# Patient Record
Sex: Female | Born: 1935 | Race: Black or African American | Hispanic: No | Marital: Single | Smoking: Never smoker
Health system: Southern US, Community
[De-identification: ages and names within clinical notes are randomized; demographics above are authoritative.]

## PROBLEM LIST (undated history)

## (undated) DIAGNOSIS — C801 Malignant (primary) neoplasm, unspecified: Secondary | ICD-10-CM

## (undated) DIAGNOSIS — I10 Essential (primary) hypertension: Secondary | ICD-10-CM

---

## 2019-04-18 ENCOUNTER — Encounter (HOSPITAL_COMMUNITY): Payer: Self-pay

## 2019-04-18 ENCOUNTER — Ambulatory Visit (HOSPITAL_COMMUNITY)
Admission: EM | Admit: 2019-04-18 | Discharge: 2019-04-18 | Disposition: A | Discharge status: Home / Self Care | Payer: Medicare Other | Attending: Urgent Care | Admitting: Urgent Care

## 2019-04-18 ENCOUNTER — Other Ambulatory Visit: Payer: Self-pay

## 2019-04-18 DIAGNOSIS — I2510 Atherosclerotic heart disease of native coronary artery without angina pectoris: Secondary | ICD-10-CM | POA: Insufficient documentation

## 2019-04-18 DIAGNOSIS — I2583 Coronary atherosclerosis due to lipid rich plaque: Secondary | ICD-10-CM | POA: Diagnosis present

## 2019-04-18 DIAGNOSIS — C8510 Unspecified B-cell lymphoma, unspecified site: Secondary | ICD-10-CM

## 2019-04-18 DIAGNOSIS — I10 Essential (primary) hypertension: Secondary | ICD-10-CM | POA: Insufficient documentation

## 2019-04-18 DIAGNOSIS — R42 Dizziness and giddiness: Secondary | ICD-10-CM

## 2019-04-18 DIAGNOSIS — R350 Frequency of micturition: Secondary | ICD-10-CM | POA: Diagnosis present

## 2019-04-18 DIAGNOSIS — N6310 Unspecified lump in the right breast, unspecified quadrant: Secondary | ICD-10-CM | POA: Diagnosis present

## 2019-04-18 HISTORY — DX: Essential (primary) hypertension: I10

## 2019-04-18 LAB — POCT URINALYSIS DIP (DEVICE)
Bilirubin Urine: NEGATIVE
Glucose, UA: NEGATIVE mg/dL
Ketones, ur: NEGATIVE mg/dL
Leukocytes,Ua: NEGATIVE
Nitrite: NEGATIVE
Protein, ur: NEGATIVE mg/dL
Specific Gravity, Urine: 1.025 (ref 1.005–1.030)
Urobilinogen, UA: 0.2 mg/dL (ref 0.0–1.0)
pH: 7 (ref 5.0–8.0)

## 2019-04-18 MED ORDER — MECLIZINE HCL 12.5 MG PO TABS: 12.5000 mg | ORAL_TABLET | Freq: Two times a day (BID) | ORAL | 0 refills | Status: DC | PRN

## 2019-04-18 NOTE — ED Provider Notes (Signed)
MRN: [id number] DOB: Dec 13, 1933  Subjective:   Jasmine Lin is a 85 y.o. female presenting for 1 day history of dizziness lasting several seconds but less than a minute when she gets up from a laying or seated position.  She has also been urinating more frequently.  Patient has extensive past medical history as documented below.  She is currently taking one blood pressure medication, lisinopril.  She has consistent follow-up with her oncologist.   No current facility-administered medications for this encounter.   Current Outpatient Medications:  .  aspirin 81 MG chewable tablet, Chew by mouth., Disp: , Rfl:  .  isosorbide mononitrate (IMDUR) 30 MG 24 hr tablet, TAKE 1 TABLE BY MOUTH DAILY, Disp: , Rfl:  .  lisinopril (ZESTRIL) 20 MG tablet, 20 mg daily., Disp: , Rfl:  .  allopurinol (ZYLOPRIM) 100 MG tablet, Take by mouth., Disp: , Rfl:     No Known Allergies   Past Medical History:  Diagnosis Date  . Arthritis  . B12 deficiency  H/O  . CAD (coronary artery disease)  2004 DES to diagonal and ostial RCA Followed by Dr. Darien Ramus with [facility].  . Cardiomyopathy (CMS-HCC) 2013  EF 60-65%  . Eye problems  . Gout  . Hypertension  . Insomnia  . Lymphoma (CMS-HCC)  . Memory changes  . Migraines  . Osteopenia  . Vitamin D deficiency     Past Surgical History:  Procedure Laterality Date  . CARDIAC CATHETERIZATION 2006 and 2010  stent patent and EF normal-stent placement x 2  . CATARACT EXTRACTION Right 07/26/2017  . CATARACT EXTRACTION Left 01/2017  . PR INSERT TUNNELED CV CATH WITH PORT N/A 08/19/2018  Procedure: INSERTION OF CENTRAL VENOUS ACCESS DEVICE; Surgeon: Livia Snellen, MD; Location: [facility]; Service: General Surgery  . PR XCAPSL CTRC RMVL INSJ IO LENS PROSTH W/O ECP Right 07/26/2017  Procedure: CATARACT RIGHT EYE; Surgeon: Elon Spanner, MD; Location: [place]; Service: Ophthalmology  . PUNCH BIOPSY Right 03/04/2017  Right  nipple, punch biopsy of skin  . TOTAL KNEE ARTHROPLASTY Left 05/2013    Family History  Problem Relation Age of Onset  . Cancer Father  . Hypertension Father  . Heart disease Brother  . Depression Neg Hx  . Mental illness Neg Hx    Immunization History  Administered Date(s) Administered  . Influenza Vaccine Quad (IIV4 PF) 44mo+ injectable 07/04/2013, 08/01/2018  . PNEUMOCOCCAL POLYSACCHARIDE 23 07/04/2013  . Pneumococcal Conjugate 13-Valent 09/25/2014    Denies drug use, smoking cigarettes, alcohol use.   ROS Denies confusion, headache, ear pain, throat pain, chest pain, shortness of breath, difficulty breathing, nausea, vomiting, belly pain, hematuria, calf pain, calf swelling.  Objective:   Vitals: BP (!) 187/79 (BP Location: Right Arm)   Pulse 78   Temp 98.2 F (36.8 C) (Oral)   Resp 16   Wt 135 lb (61.2 kg)   SpO2 97%   Recheck BP was 173/80 by PA-Jael Waldorf for left arm, seated position.   Physical Exam Constitutional:      General: She is not in acute distress.    Appearance: Normal appearance. She is well-developed. She is not ill-appearing, toxic-appearing or diaphoretic.  HENT:     Head: Normocephalic and atraumatic.     Right Ear: Tympanic membrane and external ear normal. There is no impacted cerumen.     Left Ear: Tympanic membrane and external ear normal. There is no impacted cerumen.     Nose: Nose normal.  Mouth/Throat:     Mouth: Mucous membranes are moist.  Eyes:     Extraocular Movements: Extraocular movements intact.     Pupils: Pupils are equal, round, and reactive to light.  Cardiovascular:     Rate and Rhythm: Normal rate and regular rhythm.     Pulses: Normal pulses.     Heart sounds: Normal heart sounds. No murmur. No friction rub. No gallop.   Pulmonary:     Effort: Pulmonary effort is normal. No respiratory distress.     Breath sounds: Normal breath sounds. No stridor. No wheezing, rhonchi or rales.  Skin:    General: Skin is warm and  dry.     Findings: No rash.  Neurological:     Mental Status: She is alert and oriented to person, place, and time.     Cranial Nerves: No cranial nerve deficit.     Coordination: Coordination abnormal (waddling).     Gait: Gait abnormal (mild wide gait).     Deep Tendon Reflexes: Reflexes normal.     Comments: Negative pronator drift and Romberg.  Psychiatric:        Mood and Affect: Mood normal.        Behavior: Behavior normal.        Thought Content: Thought content normal.    Results for orders placed or performed during the hospital encounter of 04/18/19 (from the past 24 hour(s))  POCT urinalysis dip (device)     Status: Abnormal   Collection Time: 04/18/19  3:22 PM  Result Value Ref Range   Glucose, UA NEGATIVE NEGATIVE mg/dL   Bilirubin Urine NEGATIVE NEGATIVE   Ketones, ur NEGATIVE NEGATIVE mg/dL   Specific Gravity, Urine 1.025 1.005 - 1.030   Hgb urine dipstick TRACE (A) NEGATIVE   pH 7.0 5.0 - 8.0   Protein, ur NEGATIVE NEGATIVE mg/dL   Urobilinogen, UA 0.2 0.0 - 1.0 mg/dL   Nitrite NEGATIVE NEGATIVE   Leukocytes,Ua NEGATIVE NEGATIVE    Assessment and Plan :   1. Dizziness   2. Essential hypertension   3. Elevated blood pressure reading with diagnosis of hypertension   4. B-cell lymphoma, unspecified B-cell lymphoma type, unspecified body region ([place])   5. Breast mass, right   6. Coronary artery disease due to lipid rich plaque     Patient is nontoxic-appearing, has reassuring physical exam findings.  Blood pressure is elevated but I am not inclined to adjust her blood pressure medications that she has very consistent follow-up with her PCP and oncologist.  Counseled patient on differential that includes need for ER evaluation including ACS, stroke, aneurysm, thrombotic event, discussed signs and symptoms for this and we were all agreeable to hold off on pursuing ER evaluation to rule these differential diagnoses out.  Patient will have very close follow-up with  her PCP.  In the meantime will use meclizine to address her dizziness, maintain hydration.  Urine cultures pending. Counseled patient on potential for adverse effects with medications prescribed/recommended today, ER and return-to-clinic precautions discussed, patient verbalized understanding.    Jaynee Eagles, PA-C 04/18/19 1553

## 2019-04-18 NOTE — ED Triage Notes (Signed)
Pt states she's dizzy when she gets up and started to move around. Pt states this started yesterday.

## 2019-04-19 LAB — URINE CULTURE: Culture: NO GROWTH

## 2019-08-01 ENCOUNTER — Encounter (HOSPITAL_COMMUNITY): Payer: Self-pay

## 2019-08-01 ENCOUNTER — Other Ambulatory Visit: Payer: Self-pay

## 2019-08-01 ENCOUNTER — Emergency Department (HOSPITAL_COMMUNITY)
Admission: EM | Admit: 2019-08-01 | Discharge: 2019-08-01 | Disposition: A | Discharge status: Home / Self Care | Payer: Medicare Other | Attending: Emergency Medicine | Admitting: Emergency Medicine

## 2019-08-01 ENCOUNTER — Emergency Department (HOSPITAL_COMMUNITY): Payer: Medicare Other

## 2019-08-01 DIAGNOSIS — Z79899 Other long term (current) drug therapy: Secondary | ICD-10-CM | POA: Insufficient documentation

## 2019-08-01 DIAGNOSIS — I10 Essential (primary) hypertension: Secondary | ICD-10-CM | POA: Insufficient documentation

## 2019-08-01 LAB — CBC WITH DIFFERENTIAL/PLATELET
Abs Immature Granulocytes: 0.01 10*3/uL (ref 0.00–0.07)
Basophils Absolute: 0 10*3/uL (ref 0.0–0.1)
Basophils Relative: 1 %
Eosinophils Absolute: 0.2 10*3/uL (ref 0.0–0.5)
Eosinophils Relative: 4 %
HCT: 38.2 % (ref 36.0–46.0)
Hemoglobin: 12 g/dL (ref 12.0–15.0)
Immature Granulocytes: 0 %
Lymphocytes Relative: 47 %
Lymphs Abs: 2 10*3/uL (ref 0.7–4.0)
MCH: 31.3 pg (ref 26.0–34.0)
MCHC: 31.4 g/dL (ref 30.0–36.0)
MCV: 99.7 fL (ref 80.0–100.0)
Monocytes Absolute: 0.4 10*3/uL (ref 0.1–1.0)
Monocytes Relative: 9 %
Neutro Abs: 1.7 10*3/uL (ref 1.7–7.7)
Neutrophils Relative %: 39 %
Platelets: 200 10*3/uL (ref 150–400)
RBC: 3.83 MIL/uL — ABNORMAL LOW (ref 3.87–5.11)
RDW: 13.2 % (ref 11.5–15.5)
WBC: 4.3 10*3/uL (ref 4.0–10.5)
nRBC: 0 % (ref 0.0–0.2)

## 2019-08-01 LAB — BASIC METABOLIC PANEL
Anion gap: 11 (ref 5–15)
BUN: 11 mg/dL (ref 8–23)
CO2: 27 mmol/L (ref 22–32)
Calcium: 9.2 mg/dL (ref 8.9–10.3)
Chloride: 104 mmol/L (ref 98–111)
Creatinine, Ser: 0.88 mg/dL (ref 0.44–1.00)
GFR calc Af Amer: >60 mL/min (ref >60)
GFR calc non Af Amer: >60 mL/min (ref >60)
Glucose, Bld: 101 mg/dL — ABNORMAL HIGH (ref 70–99)
Potassium: 3.8 mmol/L (ref 3.5–5.1)
Sodium: 142 mmol/L (ref 135–145)

## 2019-08-01 MED ORDER — ISOSORBIDE MONONITRATE ER 30 MG PO TB24
60.0000 mg | ORAL_TABLET | Freq: Every day | ORAL | Status: DC
  Administered 2019-08-01: 60 mg via ORAL
  Filled 2019-08-01 (×2): qty 2

## 2019-08-01 MED ORDER — ISOSORBIDE DINITRATE ER 40 MG PO TBCR: 40.0000 mg | EXTENDED_RELEASE_TABLET | Freq: Two times a day (BID) | ORAL | Status: DC

## 2019-08-01 MED ORDER — ISOSORBIDE MONONITRATE ER 60 MG PO TB24: 60.0000 mg | ORAL_TABLET | Freq: Every day | ORAL | 0 refills | Status: DC

## 2019-08-01 MED ORDER — LISINOPRIL 10 MG PO TABS: 10.0000 mg | ORAL_TABLET | Freq: Once | ORAL | Status: DC

## 2019-08-01 MED ORDER — LISINOPRIL 20 MG PO TABS
20.0000 mg | ORAL_TABLET | Freq: Once | ORAL | Status: AC
  Administered 2019-08-01: 20 mg via ORAL
  Filled 2019-08-01: qty 1

## 2019-08-01 NOTE — ED Notes (Signed)
RN spoke with patients son Kyung Rudd and updated on patients status. Will be picking patient up in 10-15 mins.

## 2019-08-01 NOTE — ED Provider Notes (Signed)
Emergency Department Provider Note   I have reviewed the triage vital signs and the nursing notes.   HISTORY  Chief Complaint Hypertension   HPI Jasmine Lin is a 85 y.o. female with history of hypertension who presents the emergency department today with hypertension.  Patient states that she was taken off all her medications but in the notes it appears that she still on 2 of them.  She is on lisinopril and isosorbide.  Patient denies any headache, blurry vision, neck pain, chest pain, back pain, shortness of breath, decreased urination, lower extremity swelling, abdominal swelling, paresthesias or other symptoms.   No other associated or modifying symptoms.    Past Medical History:  Diagnosis Date  . Hypertension     There are no problems to display for this patient.   History reviewed. No pertinent surgical history.  Current Outpatient Rx  . Order #: [id number] Class: Historical Med  . Order #: [id number] Class: Historical Med  . Order #: [id number] Class: Normal  . Order #: [id number] Class: Historical Med  . Order #: [id number] Class: Normal    Allergies Patient has no known allergies.  History reviewed. No pertinent family history.  Social History Social History   Tobacco Use  . Smoking status: Never Smoker  . Smokeless tobacco: Never Used  Substance Use Topics  . Alcohol use: Never  . Drug use: Never    Review of Systems  All other systems negative except as documented in the HPI. All pertinent positives and negatives as reviewed in the HPI. ____________________________________________   PHYSICAL EXAM:  VITAL SIGNS: ED Triage Vitals  Enc Vitals Group     BP 08/01/19 0208 (!) 199/71     Pulse Rate 08/01/19 0208 63     Resp 08/01/19 0208 18     Temp 08/01/19 0208 98.4 F (36.9 C)     Temp Source 08/01/19 0208 Oral     SpO2 08/01/19 0157 98 %     Weight 08/01/19 0200 134 lb 7.7 oz (61 kg)     Height 08/01/19 0200 5\' 3"  (1.6 m)     Constitutional: Alert and oriented. Well appearing and in no acute distress. Eyes: Conjunctivae are normal. PERRL. EOMI. Head: Atraumatic. Nose: No congestion/rhinnorhea. Mouth/Throat: Mucous membranes are moist.  Oropharynx non-erythematous. Neck: No stridor.  No meningeal signs.   Cardiovascular: Normal rate, regular rhythm. Good peripheral circulation. Grossly normal heart sounds.   Respiratory: Normal respiratory effort.  No retractions. Lungs CTAB. Gastrointestinal: Soft and nontender. No distention.  Musculoskeletal: No lower extremity tenderness nor edema. No gross deformities of extremities. Neurologic:  No altered mental status, able to give full seemingly accurate history.  Face is symmetric, EOM's intact, pupils equal and reactive, vision intact, tongue and uvula midline without deviation. Upper and Lower extremity motor 5/5, intact pain perception in distal extremities, 2+ reflexes in biceps, patella and achilles tendons. Able to perform finger to nose normal with both hands.  Skin:  Skin is warm, dry and intact. No rash noted.  ____________________________________________   LABS (all labs ordered are listed, but only abnormal results are displayed)  Labs Reviewed  CBC WITH DIFFERENTIAL/PLATELET - Abnormal; Notable for the following components:      Result Value   RBC 3.83 (*)    All other components within normal limits  BASIC METABOLIC PANEL - Abnormal; Notable for the following components:   Glucose, Bld 101 (*)    All other components within normal limits   ____________________________________________  EKG   EKG Interpretation  Date/Time:  Tuesday August 01 2019 05:52:57 EST Ventricular Rate:  59 PR Interval:  222 QRS Duration: 88 QT Interval:  440 QTC Calculation: 435 R Axis:   -23 Text Interpretation: Sinus bradycardia with 1st degree A-V block Minimal voltage criteria for LVH, may be normal variant ( Cornell product ) Borderline ECG No old tracing  to compare Confirmed by Merrily Pew [phone]) on 08/01/2019 6:44:15 AM       ____________________________________________  RADIOLOGY  DG Chest 2 View  Result Date: 08/01/2019 CLINICAL DATA:  Hypertensive EXAM: CHEST - 2 VIEW COMPARISON:  None. FINDINGS: Normal heart size. Coronary atherosclerotic calcification. Negative mediastinal contours. Large lung volumes without diaphragm flattening. There is no edema, consolidation, effusion, or pneumothorax. IMPRESSION: No active cardiopulmonary disease. Electronically Signed   By: Monte Fantasia M.D.   On: 08/01/2019 05:29    ____________________________________________   PROCEDURES  Procedure(s) performed:   Procedures   ____________________________________________   INITIAL IMPRESSION / ASSESSMENT AND PLAN / ED COURSE  Significant HTN at home. Not quite as bad here. Will check basic labs, but likely discharge on PCP follow up.  BP elevated by asymptomatic. Plan to increase home meds with pcp follow up.   Pertinent labs & imaging results that were available during my care of the patient were reviewed by me and considered in my medical decision making (see chart for details).  A medical screening exam was performed and I feel the patient has had an appropriate workup for their chief complaint at this time and likelihood of emergent condition existing is low. They have been counseled on decision, discharge, follow up and which symptoms necessitate immediate return to the emergency department. They or their family verbally stated understanding and agreement with plan and discharged in stable condition.   ____________________________________________  FINAL CLINICAL IMPRESSION(S) / ED DIAGNOSES  Final diagnoses:  Hypertension, unspecified type    MEDICATIONS GIVEN DURING THIS VISIT:  Medications  isosorbide mononitrate (IMDUR) 24 hr tablet 60 mg (60 mg Oral Given 08/01/19 0651)  lisinopril (ZESTRIL) tablet 20 mg (20 mg Oral  Given 08/01/19 0651)     NEW OUTPATIENT MEDICATIONS STARTED DURING THIS VISIT:  New Prescriptions   ISOSORBIDE MONONITRATE (IMDUR) 60 MG 24 HR TABLET    Take 1 tablet (60 mg total) by mouth daily.    Note:  This note was prepared with assistance of Dragon voice recognition software. Occasional wrong-word or sound-a-like substitutions may have occurred due to the inherent limitations of voice recognition software.   Sharise Lippy, Corene Cornea, MD 08/01/19 [phone]

## 2019-08-01 NOTE — ED Triage Notes (Signed)
Pt BIB [facility] for eval of hypertension. Pt reports her PCP pulled her off of all 3 blood pressure medications 2 mos ago, pt reports that she has been checking pressures at home and she noted tonight that her systolic was [id number] systolic and called EMS. Pt has no complaints, no neuro deficits, GCS 15, A&Ox4.

## 2019-08-07 ENCOUNTER — Emergency Department (HOSPITAL_COMMUNITY): Payer: Medicare Other

## 2019-08-07 ENCOUNTER — Encounter (HOSPITAL_COMMUNITY): Payer: Self-pay | Admitting: Emergency Medicine

## 2019-08-07 ENCOUNTER — Emergency Department (HOSPITAL_COMMUNITY)
Admission: EM | Admit: 2019-08-07 | Discharge: 2019-08-07 | Disposition: A | Discharge status: Home / Self Care | Payer: Medicare Other | Attending: Emergency Medicine | Admitting: Emergency Medicine

## 2019-08-07 ENCOUNTER — Other Ambulatory Visit: Payer: Self-pay

## 2019-08-07 DIAGNOSIS — I10 Essential (primary) hypertension: Secondary | ICD-10-CM | POA: Insufficient documentation

## 2019-08-07 DIAGNOSIS — Z79899 Other long term (current) drug therapy: Secondary | ICD-10-CM | POA: Insufficient documentation

## 2019-08-07 DIAGNOSIS — R42 Dizziness and giddiness: Secondary | ICD-10-CM | POA: Diagnosis not present

## 2019-08-07 DIAGNOSIS — Z7982 Long term (current) use of aspirin: Secondary | ICD-10-CM | POA: Diagnosis not present

## 2019-08-07 DIAGNOSIS — R791 Abnormal coagulation profile: Secondary | ICD-10-CM | POA: Insufficient documentation

## 2019-08-07 LAB — CBC
HCT: 38.8 % (ref 36.0–46.0)
Hemoglobin: 12.5 g/dL (ref 12.0–15.0)
MCH: 32.1 pg (ref 26.0–34.0)
MCHC: 32.2 g/dL (ref 30.0–36.0)
MCV: 99.5 fL (ref 80.0–100.0)
Platelets: 206 10*3/uL (ref 150–400)
RBC: 3.9 MIL/uL (ref 3.87–5.11)
RDW: 13.2 % (ref 11.5–15.5)
WBC: 4.6 10*3/uL (ref 4.0–10.5)
nRBC: 0 % (ref 0.0–0.2)

## 2019-08-07 LAB — I-STAT CHEM 8, ED
BUN: 16 mg/dL (ref 8–23)
Calcium, Ion: 1.05 mmol/L — ABNORMAL LOW (ref 1.15–1.40)
Chloride: 107 mmol/L (ref 98–111)
Creatinine, Ser: 0.9 mg/dL (ref 0.44–1.00)
Glucose, Bld: 102 mg/dL — ABNORMAL HIGH (ref 70–99)
HCT: 38 % (ref 36.0–46.0)
Hemoglobin: 12.9 g/dL (ref 12.0–15.0)
Potassium: 3.7 mmol/L (ref 3.5–5.1)
Sodium: 141 mmol/L (ref 135–145)
TCO2: 26 mmol/L (ref 22–32)

## 2019-08-07 LAB — DIFFERENTIAL
Abs Immature Granulocytes: 0 10*3/uL (ref 0.00–0.07)
Basophils Absolute: 0 10*3/uL (ref 0.0–0.1)
Basophils Relative: 0 %
Eosinophils Absolute: 0.2 10*3/uL (ref 0.0–0.5)
Eosinophils Relative: 4 %
Immature Granulocytes: 0 %
Lymphocytes Relative: 45 %
Lymphs Abs: 2.1 10*3/uL (ref 0.7–4.0)
Monocytes Absolute: 0.4 10*3/uL (ref 0.1–1.0)
Monocytes Relative: 9 %
Neutro Abs: 1.9 10*3/uL (ref 1.7–7.7)
Neutrophils Relative %: 42 %

## 2019-08-07 LAB — COMPREHENSIVE METABOLIC PANEL
ALT: 16 U/L (ref 0–44)
AST: 21 U/L (ref 15–41)
Albumin: 3.5 g/dL (ref 3.5–5.0)
Alkaline Phosphatase: 84 U/L (ref 38–126)
Anion gap: 11 (ref 5–15)
BUN: 15 mg/dL (ref 8–23)
CO2: 22 mmol/L (ref 22–32)
Calcium: 9 mg/dL (ref 8.9–10.3)
Chloride: 107 mmol/L (ref 98–111)
Creatinine, Ser: 0.91 mg/dL (ref 0.44–1.00)
GFR calc Af Amer: >60 mL/min (ref >60)
GFR calc non Af Amer: 58 mL/min — ABNORMAL LOW (ref >60)
Glucose, Bld: 106 mg/dL — ABNORMAL HIGH (ref 70–99)
Potassium: 3.8 mmol/L (ref 3.5–5.1)
Sodium: 140 mmol/L (ref 135–145)
Total Bilirubin: 0.5 mg/dL (ref 0.3–1.2)
Total Protein: 6.8 g/dL (ref 6.5–8.1)

## 2019-08-07 LAB — CBG MONITORING, ED: Glucose-Capillary: 100 mg/dL — ABNORMAL HIGH (ref 70–99)

## 2019-08-07 LAB — APTT: aPTT: 32 seconds (ref 24–36)

## 2019-08-07 LAB — PROTIME-INR
INR: 1 (ref 0.8–1.2)
Prothrombin Time: 13.2 seconds (ref 11.4–15.2)

## 2019-08-07 MED ORDER — SODIUM CHLORIDE 0.9% FLUSH: 3.0000 mL | Freq: Once | INTRAVENOUS | Status: DC

## 2019-08-07 MED ORDER — ISOSORBIDE MONONITRATE ER 30 MG PO TB24
60.0000 mg | ORAL_TABLET | Freq: Every day | ORAL | Status: DC
  Administered 2019-08-07 (×2): 60 mg via ORAL
  Filled 2019-08-07 (×2): qty 2

## 2019-08-07 MED ORDER — LISINOPRIL 20 MG PO TABS
20.0000 mg | ORAL_TABLET | Freq: Every day | ORAL | Status: DC
  Administered 2019-08-07 (×2): 20 mg via ORAL
  Filled 2019-08-07 (×2): qty 1

## 2019-08-07 MED ORDER — HYDRALAZINE HCL 20 MG/ML IJ SOLN: 10.0000 mg | Freq: Once | INTRAMUSCULAR | Status: DC

## 2019-08-07 NOTE — Discharge Instructions (Addendum)
Be sure to keep your appointment with your primary care provider later today.

## 2019-08-07 NOTE — ED Notes (Signed)
Patient transported to MRI 

## 2019-08-07 NOTE — ED Provider Notes (Signed)
[facility] DEPARTMENT Provider Note   CSN: [id number] Arrival date & time: 08/07/19  0025     History Chief Complaint  Patient presents with  . Hypertension    Jasmine Lin is a 85 y.o. female.  Patient presents to the emergency department with a chief complaint of high blood pressure.  She states that her blood pressure has been running high.  She reports taking her regular medications.  States that she has been feeling dizzy.  She denies any chest pain, shortness of breath, numbness, weakness, or tingling of her extremities.  Denies any new vision changes or slurred speech.  The history is provided by the patient. No language interpreter was used.       Past Medical History:  Diagnosis Date  . Hypertension     There are no problems to display for this patient.   History reviewed. No pertinent surgical history.   OB History   No obstetric history on file.     No family history on file.  Social History   Tobacco Use  . Smoking status: Never Smoker  . Smokeless tobacco: Never Used  Substance Use Topics  . Alcohol use: Never  . Drug use: Never    Home Medications Prior to Admission medications   Medication Sig Start Date End Date Taking? Authorizing Provider  allopurinol (ZYLOPRIM) 100 MG tablet Take by mouth.    [provider]  aspirin 81 MG chewable tablet Chew by mouth. 12/26/12   [provider]  isosorbide mononitrate (IMDUR) 60 MG 24 hr tablet Take 1 tablet (60 mg total) by mouth daily. 08/01/19   Mesner, Corene Cornea, MD  lisinopril (ZESTRIL) 20 MG tablet 20 mg daily. 09/11/14   [provider]  meclizine (ANTIVERT) 12.5 MG tablet Take 1 tablet (12.5 mg total) by mouth 2 (two) times daily as needed for dizziness. 04/18/19   Jaynee Eagles, PA-C    Allergies    Patient has no known allergies.  Review of Systems   Review of Systems  All other systems reviewed and are negative.   Physical Exam Updated  Vital Signs BP (!) 210/97   Pulse 66   Temp 98.1 F (36.7 C) (Oral)   Resp 17   SpO2 100%   Physical Exam Vitals and nursing note reviewed.  Constitutional:      General: She is not in acute distress.    Appearance: She is well-developed.  HENT:     Head: Normocephalic and atraumatic.  Eyes:     Conjunctiva/sclera: Conjunctivae normal.  Cardiovascular:     Rate and Rhythm: Normal rate and regular rhythm.     Heart sounds: No murmur.  Pulmonary:     Effort: Pulmonary effort is normal. No respiratory distress.     Breath sounds: Normal breath sounds.  Abdominal:     Palpations: Abdomen is soft.     Tenderness: There is no abdominal tenderness.  Musculoskeletal:     Cervical back: Neck supple.  Skin:    General: Skin is warm and dry.  Neurological:     Mental Status: She is alert and oriented to person, place, and time.     Comments: CN III-XII intact, speech is clear, movements are goal oriented  Psychiatric:        Mood and Affect: Mood normal.        Behavior: Behavior normal.     ED Results / Procedures / Treatments   Labs (all labs ordered are listed, but only  abnormal results are displayed) Labs Reviewed  COMPREHENSIVE METABOLIC PANEL - Abnormal; Notable for the following components:      Result Value   Glucose, Bld 106 (*)    GFR calc non Af Amer 58 (*)    All other components within normal limits  I-STAT CHEM 8, ED - Abnormal; Notable for the following components:   Glucose, Bld 102 (*)    Calcium, Ion 1.05 (*)    All other components within normal limits  CBG MONITORING, ED - Abnormal; Notable for the following components:   Glucose-Capillary 100 (*)    All other components within normal limits  PROTIME-INR  APTT  CBC  DIFFERENTIAL    EKG None  Radiology CT HEAD WO CONTRAST  Result Date: 08/07/2019 CLINICAL DATA:  Dizziness EXAM: CT HEAD WITHOUT CONTRAST TECHNIQUE: Contiguous axial images were obtained from the base of the skull through the  vertex without intravenous contrast. COMPARISON:  None. FINDINGS: Brain: There is no mass, hemorrhage or extra-axial collection. The size and configuration of the ventricles and extra-axial CSF spaces are normal. There is hypoattenuation of the white matter, most commonly indicating chronic small vessel disease. Vascular: Atherosclerotic calcification of the vertebral and internal carotid arteries at the skull base. No abnormal hyperdensity of the major intracranial arteries or dural venous sinuses. Skull: The visualized skull base, calvarium and extracranial soft tissues are normal. Sinuses/Orbits: No fluid levels or advanced mucosal thickening of the visualized paranasal sinuses. No mastoid or middle ear effusion. The orbits are normal. IMPRESSION: Findings of chronic small vessel disease without acute intracranial abnormality. Electronically Signed   By: Ulyses Jarred M.D.   On: 08/07/2019 01:08    Procedures Procedures (including critical care time)  Medications Ordered in ED Medications  sodium chloride flush (NS) 0.9 % injection 3 mL (has no administration in time range)  isosorbide mononitrate (IMDUR) 24 hr tablet 60 mg (has no administration in time range)  lisinopril (ZESTRIL) tablet 20 mg (has no administration in time range)    ED Course  I have reviewed the triage vital signs and the nursing notes.  Pertinent labs & imaging results that were available during my care of the patient were reviewed by me and considered in my medical decision making (see chart for details).    MDM Rules/Calculators/A&P                      Patient here with hypertension.  She also reports that she has been dizzy, and feels off balance.  CT head is negative.  Patient has fairly hypertensive.  Will give patient her regular home blood pressure medications.  I question compliance at home.  We will check MRI.  Anticipate discharge pending normal MRI, and improving blood pressures.  She is otherwise  asymptomatic.  Patient signed out to oncoming team, who will continue care.    Final Clinical Impression(s) / ED Diagnoses Final diagnoses:  None    Rx / DC Orders ED Discharge Orders    None       Montine Circle, PA-C 08/07/19 [id number]    Veryl Speak, MD 08/07/19 [phone]

## 2019-08-07 NOTE — ED Triage Notes (Signed)
Pt BIB [facility] from home for evaluation of HTN. Hx HTN, seen in this ED 12/22 for same. Pt started on 60mg  imdur. States she has been taking her BP meds as prescribed. Last BP with EMS: 240/100. Denies chest pain/shortness of breath/headache, c/o dizziness.

## 2019-08-07 NOTE — ED Provider Notes (Signed)
Jasmine Lin is a 85 y.o. female, with a history of HTN, presenting to the ED with high blood pressure and dizziness.  HPI from Lorre Munroe, PA-C: "Patient presents to the emergency department with a chief complaint of high blood pressure.  She states that her blood pressure has been running high.  She reports taking her regular medications.  States that she has been feeling dizzy.  She denies any chest pain, shortness of breath, numbness, weakness, or tingling of her extremities.  Denies any new vision changes or slurred speech."  Past Medical History:  Diagnosis Date  . Hypertension     Physical Exam  BP (!) 199/97   Pulse 60   Temp 98.1 F (36.7 C) (Oral)   Resp 13   SpO2 99%   Physical Exam Vitals and nursing note reviewed.  Constitutional:      General: She is not in acute distress.    Appearance: She is well-developed. She is not diaphoretic.  HENT:     Head: Normocephalic and atraumatic.     Mouth/Throat:     Mouth: Mucous membranes are moist.     Pharynx: Oropharynx is clear.  Eyes:     Conjunctiva/sclera: Conjunctivae normal.  Cardiovascular:     Rate and Rhythm: Normal rate and regular rhythm.     Pulses: Normal pulses.          Radial pulses are 2+ on the right side and 2+ on the left side.       Posterior tibial pulses are 2+ on the right side and 2+ on the left side.     Heart sounds: Normal heart sounds.     Comments: Tactile temperature in the extremities appropriate and equal bilaterally. Pulmonary:     Effort: Pulmonary effort is normal. No respiratory distress.     Breath sounds: Normal breath sounds.  Abdominal:     Palpations: Abdomen is soft.     Tenderness: There is no abdominal tenderness. There is no guarding.  Musculoskeletal:     Cervical back: Neck supple.     Right lower leg: No edema.     Left lower leg: No edema.  Lymphadenopathy:     Cervical: No cervical adenopathy.  Skin:    General: Skin is warm and dry.  Neurological:   Mental Status: She is alert.     Comments: Sensation grossly intact to light touch in the extremities.  Grip strengths equal bilaterally.  Strength 5/5 in all extremities. No gait disturbance. Coordination intact. Cranial nerves III-XII grossly intact. No facial droop.   Psychiatric:        Mood and Affect: Mood and affect normal.        Speech: Speech normal.        Behavior: Behavior normal.     ED Course/Procedures     Procedures   Abnormal Labs Reviewed  COMPREHENSIVE METABOLIC PANEL - Abnormal; Notable for the following components:      Result Value   Glucose, Bld 106 (*)    GFR calc non Af Amer 58 (*)    All other components within normal limits  I-STAT CHEM 8, ED - Abnormal; Notable for the following components:   Glucose, Bld 102 (*)    Calcium, Ion 1.05 (*)    All other components within normal limits  CBG MONITORING, ED - Abnormal; Notable for the following components:   Glucose-Capillary 100 (*)    All other components within normal limits   DG Chest 2 View  Result Date: 08/01/2019 CLINICAL DATA:  Hypertensive EXAM: CHEST - 2 VIEW COMPARISON:  None. FINDINGS: Normal heart size. Coronary atherosclerotic calcification. Negative mediastinal contours. Large lung volumes without diaphragm flattening. There is no edema, consolidation, effusion, or pneumothorax. IMPRESSION: No active cardiopulmonary disease. Electronically Signed   By: Monte Fantasia M.D.   On: 08/01/2019 05:29   CT HEAD WO CONTRAST  Result Date: 08/07/2019 CLINICAL DATA:  Dizziness EXAM: CT HEAD WITHOUT CONTRAST TECHNIQUE: Contiguous axial images were obtained from the base of the skull through the vertex without intravenous contrast. COMPARISON:  None. FINDINGS: Brain: There is no mass, hemorrhage or extra-axial collection. The size and configuration of the ventricles and extra-axial CSF spaces are normal. There is hypoattenuation of the white matter, most commonly indicating chronic small vessel disease.  Vascular: Atherosclerotic calcification of the vertebral and internal carotid arteries at the skull base. No abnormal hyperdensity of the major intracranial arteries or dural venous sinuses. Skull: The visualized skull base, calvarium and extracranial soft tissues are normal. Sinuses/Orbits: No fluid levels or advanced mucosal thickening of the visualized paranasal sinuses. No mastoid or middle ear effusion. The orbits are normal. IMPRESSION: Findings of chronic small vessel disease without acute intracranial abnormality. Electronically Signed   By: Ulyses Jarred M.D.   On: 08/07/2019 01:08   MR BRAIN WO CONTRAST  Result Date: 08/07/2019 CLINICAL DATA:  Central vertigo.  Hypertension EXAM: MRI HEAD WITHOUT CONTRAST TECHNIQUE: Multiplanar, multiecho pulse sequences of the brain and surrounding structures were obtained without intravenous contrast. COMPARISON:  None. FINDINGS: Brain: No acute infarction, hemorrhage, hydrocephalus, extra-axial collection or mass lesion. White matter disease that is moderate and consistent with chronic small vessel ischemia in this setting. No chronic hemorrhagic injury. Age normal brain volume. No evidence of posterior reversible encephalopathy syndrome. Unremarkable appearance of the temporal bones. Vascular: Normal flow voids Skull and upper cervical spine: Normal marrow signal Sinuses/Orbits: Normal IMPRESSION: 1. No emergent finding. 2. Chronic small vessel ischemia. Electronically Signed   By: Monte Fantasia M.D.   On: 08/07/2019 07:34    EKG Interpretation  Date/Time:  Monday August 07 2019 05:28:49 EST Ventricular Rate:  68 PR Interval:    QRS Duration: 89 QT Interval:  423 QTC Calculation: 450 R Axis:   4 Text Interpretation: Sinus rhythm Borderline prolonged PR interval Probable left ventricular hypertrophy Confirmed by Veryl Speak [phone]) on 08/07/2019 5:54:19 AM       MDM   Clinical Course as of Aug 06 932  Mon Aug 07, 2019  0721 Patient in  MRI.   [SJ]  0815 Spoke with the patient's son via phone. He does confirm patient has an appointment at 3 PM today with her PCP. Patient has had difficulty controlling her high blood pressure during the month of December and has experienced dizziness that he thinks is likely associated with her hypertension. He adds that the patient was previously taking isosorbide, atenolol, and lisinopril, but in February 2020 patient was removed from the atenolol and the lisinopril due to blood pressure concerns associated with her chemotherapy.  When her chemotherapy sessions concluded in April of this year, these other medications were not reintroduced.   [SJ]  0932 Attempted to call patient's son back to update him on change in pressure and impending discharge. No answer and VM "not set up."   [SJ]    Clinical Course User Index [SJ] Trystyn Sitts C, PA-C   Patient presents with concerns over hypertension and occasional dizziness.  No focal neuro deficits on  my exam.  No acute abnormalities on head CT or MRI.  Lab work overall reassuring. We had plans to initiate IV medication for control of her blood pressure here in the ED, however, this was ultimately unnecessary. Patient symptoms improved once blood pressure was under better control.  She has an appointment with her PCP today at 3 PM and was encouraged to keep this appointment. The patient was given instructions for home care as well as return precautions. Patient voices understanding of these instructions, accepts the plan, and is comfortable with discharge.   Findings and plan of care discussed with Antony Blackbird, MD.   Vitals:   08/07/19 0527 08/07/19 0530 08/07/19 0545 08/07/19 0600  BP: (!) 201/94 (!) 210/97 (!) 221/95 (!) 199/97  Pulse: 72 66 74 60  Resp: 16 17 17 13   Temp: 98.1 F (36.7 C)     TempSrc: Oral     SpO2: 100% 100% 100% 99%   Vitals:   08/07/19 0845 08/07/19 0900 08/07/19 0907 08/07/19 0915  BP: (!) 178/91 (!) 142/62 (!)  142/62 (!) 154/82  Pulse: 61 (!) 58 (!) 56 62  Resp:  16 18 16   Temp:   97.6 F (36.4 C)   TempSrc:   Oral   SpO2: 98% 98% 97% 97%      Lorayne Bender, PA-C 08/07/19 [id number]    Tegeler, Gwenyth Allegra, MD 08/07/19 [phone]

## 2019-08-07 NOTE — ED Notes (Signed)
Patient verbalizes understanding of discharge instructions. Opportunity for questioning and answers were provided. Armband removed by staff, pt discharged from ED. Wheeled out to lobby  

## 2019-11-09 ENCOUNTER — Ambulatory Visit: Payer: Medicare Other | Attending: Family

## 2019-11-09 DIAGNOSIS — Z23 Encounter for immunization: Secondary | ICD-10-CM

## 2019-11-09 NOTE — Progress Notes (Signed)
   Covid-19 Vaccination Clinic  Name:  Jasmine Lin    MRN: [id number] DOB: May 08, 1934  11/09/2019  [name] was observed post Covid-19 immunization for 15 minutes without incident. She was provided with Vaccine Information Sheet and instruction to access the V-Safe system.   [name] was instructed to call 911 with any severe reactions post vaccine: Marland Kitchen Difficulty breathing  . Swelling of face and throat  . A fast heartbeat  . A bad rash all over body  . Dizziness and weakness   Immunizations Administered    Name Date Dose VIS Date Route   Moderna COVID-19 Vaccine 11/09/2019  4:12 PM 0.5 mL 07/11/2019 Intramuscular   Manufacturer: Moderna   Lot: [id number]   [id number]

## 2019-12-12 ENCOUNTER — Ambulatory Visit: Payer: Medicare Other | Attending: Family

## 2019-12-12 DIAGNOSIS — Z23 Encounter for immunization: Secondary | ICD-10-CM

## 2019-12-12 NOTE — Progress Notes (Signed)
   Covid-19 Vaccination Clinic  Name:  Jasmine Lin    MRN: [id number] DOB: 12/17/1933  12/12/2019  [name] was observed post Covid-19 immunization for 15 minutes without incident. She was provided with Vaccine Information Sheet and instruction to access the V-Safe system.   [name] was instructed to call 911 with any severe reactions post vaccine: Marland Kitchen Difficulty breathing  . Swelling of face and throat  . A fast heartbeat  . A bad rash all over body  . Dizziness and weakness   Immunizations Administered    Name Date Dose VIS Date Route   Moderna COVID-19 Vaccine 12/12/2019  3:22 PM 0.5 mL 07/2019 Intramuscular   Manufacturer: Moderna   Lot: [id number]   EstoBE:[id number]

## 2020-10-18 ENCOUNTER — Other Ambulatory Visit: Payer: Self-pay

## 2020-10-18 ENCOUNTER — Inpatient Hospital Stay (HOSPITAL_COMMUNITY)
Admission: EM | Admit: 2020-10-18 | Discharge: 2020-10-22 | DRG: 683 | Disposition: A | Discharge status: Hospice | Payer: Medicare Other | Attending: Internal Medicine | Admitting: Internal Medicine

## 2020-10-18 ENCOUNTER — Encounter (HOSPITAL_COMMUNITY): Payer: Self-pay | Admitting: *Deleted

## 2020-10-18 DIAGNOSIS — Z885 Allergy status to narcotic agent status: Secondary | ICD-10-CM

## 2020-10-18 DIAGNOSIS — N179 Acute kidney failure, unspecified: Principal | ICD-10-CM | POA: Diagnosis present

## 2020-10-18 DIAGNOSIS — Z515 Encounter for palliative care: Secondary | ICD-10-CM

## 2020-10-18 DIAGNOSIS — Z7982 Long term (current) use of aspirin: Secondary | ICD-10-CM

## 2020-10-18 DIAGNOSIS — I10 Essential (primary) hypertension: Secondary | ICD-10-CM | POA: Diagnosis present

## 2020-10-18 DIAGNOSIS — R17 Unspecified jaundice: Secondary | ICD-10-CM

## 2020-10-18 DIAGNOSIS — Z6823 Body mass index (BMI) 23.0-23.9, adult: Secondary | ICD-10-CM

## 2020-10-18 DIAGNOSIS — R54 Age-related physical debility: Secondary | ICD-10-CM | POA: Diagnosis present

## 2020-10-18 DIAGNOSIS — Z888 Allergy status to other drugs, medicaments and biological substances status: Secondary | ICD-10-CM

## 2020-10-18 DIAGNOSIS — R748 Abnormal levels of other serum enzymes: Secondary | ICD-10-CM

## 2020-10-18 DIAGNOSIS — R627 Adult failure to thrive: Secondary | ICD-10-CM | POA: Diagnosis present

## 2020-10-18 DIAGNOSIS — R531 Weakness: Principal | ICD-10-CM

## 2020-10-18 DIAGNOSIS — Z79899 Other long term (current) drug therapy: Secondary | ICD-10-CM

## 2020-10-18 DIAGNOSIS — Z66 Do not resuscitate: Secondary | ICD-10-CM | POA: Diagnosis present

## 2020-10-18 DIAGNOSIS — I959 Hypotension, unspecified: Secondary | ICD-10-CM | POA: Diagnosis present

## 2020-10-18 DIAGNOSIS — C23 Malignant neoplasm of gallbladder: Secondary | ICD-10-CM | POA: Diagnosis present

## 2020-10-18 DIAGNOSIS — Z20822 Contact with and (suspected) exposure to covid-19: Secondary | ICD-10-CM | POA: Diagnosis present

## 2020-10-18 DIAGNOSIS — R64 Cachexia: Secondary | ICD-10-CM | POA: Diagnosis present

## 2020-10-18 DIAGNOSIS — E860 Dehydration: Secondary | ICD-10-CM

## 2020-10-18 DIAGNOSIS — C8510 Unspecified B-cell lymphoma, unspecified site: Secondary | ICD-10-CM | POA: Diagnosis present

## 2020-10-18 DIAGNOSIS — K8020 Calculus of gallbladder without cholecystitis without obstruction: Secondary | ICD-10-CM | POA: Diagnosis present

## 2020-10-18 HISTORY — DX: Malignant (primary) neoplasm, unspecified: C80.1

## 2020-10-18 LAB — PROTIME-INR
INR: 1.4 — ABNORMAL HIGH (ref 0.8–1.2)
Prothrombin Time: 16.9 seconds — ABNORMAL HIGH (ref 11.4–15.2)

## 2020-10-18 MED ORDER — SODIUM CHLORIDE 0.9 % IV BOLUS
500.0000 mL | Freq: Once | INTRAVENOUS | Status: AC
  Administered 2020-10-19: 500 mL via INTRAVENOUS

## 2020-10-18 NOTE — ED Triage Notes (Signed)
BIB [facility] from home. Live at home with family. Pt has not been eating or drinking for past two days with fatigue. Hx: liver cancer, gallbladder cancer, lymphoma. Pt at baseline.   108/50 62 heart rate 120 CBG

## 2020-10-18 NOTE — ED Notes (Signed)
The pt is asking for her son who is at home  The pt is insistant that her son is in the waiting room  But the doctor called him at home

## 2020-10-18 NOTE — ED Provider Notes (Signed)
[facility] EMERGENCY DEPARTMENT Provider Note   CSN: [id number] Arrival date & time: 10/18/20  2154     History Chief Complaint  Patient presents with  . Failure To Thrive    Jasmine Lin is a 87 y.o. female.  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  Patient cannot provide any details secondary to acuity of condition. EMS reports the family became concerned as the patient was eating and drinking less over the past 3 or 4 days, and was found to be hypotensive today.  No report of skin color changes by EMS, she did receive fluids, had slight improvement in her blood pressure. Level 5 caveat secondary to acuity of condition.  After my initial evaluation of the patient I discussed her care with her son.  He notes that the patient has not received interventions for her malignancy in at least weeks, possibly longer.  She is scheduled to follow-up with her oncologist in 2 days.  He was open to speaking with palliative care with her cancer team, but had not yet done so.  However, the patient was designated as DNR by the patient's son and his brother. Son notes that over the past week the patient has had progressive weakness, increasing confusion.  He is open to palliative care consult if he requires hospitalization here.    Past Medical History:  Diagnosis Date  . Hypertension     There are no problems to display for this patient.   No past surgical history on file.   OB History   No obstetric history on file.     No family history on file.  Social History   Tobacco Use  . Smoking status: Never Smoker  . Smokeless tobacco: Never Used  Substance Use Topics  . Alcohol use: Never  . Drug use: Never    Home Medications Prior to Admission medications   Medication Sig Start Date End Date Taking? Authorizing  Provider  allopurinol (ZYLOPRIM) 100 MG tablet Take by mouth.    [provider]  aspirin 81 MG chewable tablet Chew by mouth. 12/26/12   [provider]  isosorbide mononitrate (IMDUR) 60 MG 24 hr tablet Take 1 tablet (60 mg total) by mouth daily. 08/01/19   Mesner, Corene Cornea, MD  lisinopril (ZESTRIL) 20 MG tablet 20 mg daily. 09/11/14   [provider]  meclizine (ANTIVERT) 12.5 MG tablet Take 1 tablet (12.5 mg total) by mouth 2 (two) times daily as needed for dizziness. 04/18/19   Jaynee Eagles, PA-C    Allergies    Patient has no known allergies.  Review of Systems   Review of Systems  Unable to perform ROS: Acuity of condition    Physical Exam Updated Vital Signs Temp 98.3 F (36.8 C) (Oral)   Physical Exam Vitals and nursing note reviewed.  Constitutional:      Appearance: She is well-developed. She is ill-appearing.     Comments: Withdrawn, minimally verbal frail elderly female grossly jaundiced  HENT:     Head: Normocephalic and atraumatic.  Eyes:     General: Scleral icterus present.  Cardiovascular:     Rate and Rhythm: Normal rate and regular rhythm.  Pulmonary:     Effort: Pulmonary effort is normal. No respiratory distress.     Breath sounds: Normal breath sounds. No stridor.  Abdominal:     General: There is  no distension.  Musculoskeletal:        General: No deformity.     Comments: Diffuse atrophy  Skin:    General: Skin is warm and dry.     Coloration: Skin is jaundiced.  Neurological:     Cranial Nerves: No cranial nerve deficit.     Comments: Patient does seem to whisper responses to some questions, though inconsistently.  There is diffuse severe atrophy.  No facial asymmetry, when she is incapable of answering, speech is minimally audible, but appropriate.  Psychiatric:     Comments: Withdrawn     ED Results / Procedures / Treatments   Labs (all labs ordered are listed, but only abnormal results are displayed) Labs Reviewed   COMPREHENSIVE METABOLIC PANEL  CBC WITH DIFFERENTIAL/PLATELET  LIPASE, BLOOD  PROTIME-INR    EKG None  Radiology No results found.  Procedures Procedures   Medications Ordered in ED Medications  sodium chloride 0.9 % bolus 500 mL (has no administration in time range)    ED Course  I have reviewed the triage vital signs and the nursing notes.  Pertinent labs & imaging results that were available during my care of the patient were reviewed by me and considered in my medical decision making (see chart for details).    EMR reviewed [facility]) -DNR confirmed per chart - " Advance care directives: 4/8 Had a 20-minute discussion with pt and son Jasmine Lin, she would like him and other son to be power of attorney, patient son Jasmine Lin was here during the discussion and advanced care directive forms were completed. She has code status updated to DNR/DNI and an out of facility DNR form given to pt. Will see if our notary can get the form notarized tomorrow, given inability for witnesses to come in due to covid visitor restrictions."  12:05 AM On signout the patient is in similar condition. Labs were delayed in being obtained, anticipation of the patient being admitted given her weakness, history of multiple malignancies, jaundice, scleral icterus, and clinical dehydration status.  Dr. Tyrone Nine is aware of the patient.  Final Clinical Impression(s) / ED Diagnoses Final diagnoses:  Weakness  Dehydration     Carmin Muskrat, MD 10/19/20 0006

## 2020-10-19 ENCOUNTER — Encounter (HOSPITAL_COMMUNITY): Payer: Self-pay | Admitting: Family Medicine

## 2020-10-19 ENCOUNTER — Inpatient Hospital Stay (HOSPITAL_COMMUNITY): Payer: Medicare Other

## 2020-10-19 DIAGNOSIS — Z6823 Body mass index (BMI) 23.0-23.9, adult: Secondary | ICD-10-CM | POA: Diagnosis not present

## 2020-10-19 DIAGNOSIS — K8020 Calculus of gallbladder without cholecystitis without obstruction: Secondary | ICD-10-CM | POA: Diagnosis present

## 2020-10-19 DIAGNOSIS — N179 Acute kidney failure, unspecified: Principal | ICD-10-CM

## 2020-10-19 DIAGNOSIS — C23 Malignant neoplasm of gallbladder: Secondary | ICD-10-CM | POA: Diagnosis present

## 2020-10-19 DIAGNOSIS — E860 Dehydration: Secondary | ICD-10-CM

## 2020-10-19 DIAGNOSIS — Z888 Allergy status to other drugs, medicaments and biological substances status: Secondary | ICD-10-CM | POA: Diagnosis not present

## 2020-10-19 DIAGNOSIS — R531 Weakness: Secondary | ICD-10-CM | POA: Diagnosis not present

## 2020-10-19 DIAGNOSIS — Z515 Encounter for palliative care: Secondary | ICD-10-CM | POA: Diagnosis not present

## 2020-10-19 DIAGNOSIS — Z66 Do not resuscitate: Secondary | ICD-10-CM

## 2020-10-19 DIAGNOSIS — R64 Cachexia: Secondary | ICD-10-CM | POA: Diagnosis present

## 2020-10-19 DIAGNOSIS — Z79899 Other long term (current) drug therapy: Secondary | ICD-10-CM | POA: Diagnosis not present

## 2020-10-19 DIAGNOSIS — Z7189 Other specified counseling: Secondary | ICD-10-CM

## 2020-10-19 DIAGNOSIS — I10 Essential (primary) hypertension: Secondary | ICD-10-CM | POA: Diagnosis present

## 2020-10-19 DIAGNOSIS — R748 Abnormal levels of other serum enzymes: Secondary | ICD-10-CM | POA: Diagnosis not present

## 2020-10-19 DIAGNOSIS — R17 Unspecified jaundice: Secondary | ICD-10-CM

## 2020-10-19 DIAGNOSIS — Z7982 Long term (current) use of aspirin: Secondary | ICD-10-CM | POA: Diagnosis not present

## 2020-10-19 DIAGNOSIS — C8510 Unspecified B-cell lymphoma, unspecified site: Secondary | ICD-10-CM | POA: Diagnosis present

## 2020-10-19 DIAGNOSIS — R54 Age-related physical debility: Secondary | ICD-10-CM | POA: Diagnosis present

## 2020-10-19 DIAGNOSIS — R627 Adult failure to thrive: Secondary | ICD-10-CM

## 2020-10-19 DIAGNOSIS — Z20822 Contact with and (suspected) exposure to covid-19: Secondary | ICD-10-CM | POA: Diagnosis present

## 2020-10-19 DIAGNOSIS — Z789 Other specified health status: Secondary | ICD-10-CM

## 2020-10-19 DIAGNOSIS — Z885 Allergy status to narcotic agent status: Secondary | ICD-10-CM | POA: Diagnosis not present

## 2020-10-19 DIAGNOSIS — I959 Hypotension, unspecified: Secondary | ICD-10-CM | POA: Diagnosis present

## 2020-10-19 LAB — CBC WITH DIFFERENTIAL/PLATELET
Abs Immature Granulocytes: 0.04 10*3/uL (ref 0.00–0.07)
Basophils Absolute: 0 10*3/uL (ref 0.0–0.1)
Basophils Relative: 0 %
Eosinophils Absolute: 0 10*3/uL (ref 0.0–0.5)
Eosinophils Relative: 0 %
HCT: 26.9 % — ABNORMAL LOW (ref 36.0–46.0)
Hemoglobin: 9.4 g/dL — ABNORMAL LOW (ref 12.0–15.0)
Immature Granulocytes: 1 %
Lymphocytes Relative: 19 %
Lymphs Abs: 1.3 10*3/uL (ref 0.7–4.0)
MCH: 32.6 pg (ref 26.0–34.0)
MCHC: 34.9 g/dL (ref 30.0–36.0)
MCV: 93.4 fL (ref 80.0–100.0)
Monocytes Absolute: 0.4 10*3/uL (ref 0.1–1.0)
Monocytes Relative: 5 %
Neutro Abs: 5.1 10*3/uL (ref 1.7–7.7)
Neutrophils Relative %: 75 %
Platelets: 281 10*3/uL (ref 150–400)
RBC: 2.88 MIL/uL — ABNORMAL LOW (ref 3.87–5.11)
RDW: 24.2 % — ABNORMAL HIGH (ref 11.5–15.5)
WBC: 6.8 10*3/uL (ref 4.0–10.5)
nRBC: 0 % (ref 0.0–0.2)

## 2020-10-19 LAB — CBC
HCT: 27.1 % — ABNORMAL LOW (ref 36.0–46.0)
Hemoglobin: 9.7 g/dL — ABNORMAL LOW (ref 12.0–15.0)
MCH: 33.3 pg (ref 26.0–34.0)
MCHC: 35.8 g/dL (ref 30.0–36.0)
MCV: 93.1 fL (ref 80.0–100.0)
Platelets: 277 10*3/uL (ref 150–400)
RBC: 2.91 MIL/uL — ABNORMAL LOW (ref 3.87–5.11)
RDW: 24 % — ABNORMAL HIGH (ref 11.5–15.5)
WBC: 7.4 10*3/uL (ref 4.0–10.5)
nRBC: 0 % (ref 0.0–0.2)

## 2020-10-19 LAB — COMPREHENSIVE METABOLIC PANEL
ALT: 101 U/L — ABNORMAL HIGH (ref 0–44)
ALT: 97 U/L — ABNORMAL HIGH (ref 0–44)
AST: 173 U/L — ABNORMAL HIGH (ref 15–41)
AST: 162 U/L — ABNORMAL HIGH (ref 15–41)
Albumin: 1.8 g/dL — ABNORMAL LOW (ref 3.5–5.0)
Albumin: 1.7 g/dL — ABNORMAL LOW (ref 3.5–5.0)
Alkaline Phosphatase: 366 U/L — ABNORMAL HIGH (ref 38–126)
Alkaline Phosphatase: 363 U/L — ABNORMAL HIGH (ref 38–126)
Anion gap: 10 (ref 5–15)
Anion gap: 10 (ref 5–15)
BUN: 64 mg/dL — ABNORMAL HIGH (ref 8–23)
BUN: 65 mg/dL — ABNORMAL HIGH (ref 8–23)
CO2: 20 mmol/L — ABNORMAL LOW (ref 22–32)
CO2: 19 mmol/L — ABNORMAL LOW (ref 22–32)
Calcium: 9.1 mg/dL (ref 8.9–10.3)
Calcium: 9 mg/dL (ref 8.9–10.3)
Chloride: 105 mmol/L (ref 98–111)
Chloride: 108 mmol/L (ref 98–111)
Creatinine, Ser: 1.92 mg/dL — ABNORMAL HIGH (ref 0.44–1.00)
Creatinine, Ser: 1.81 mg/dL — ABNORMAL HIGH (ref 0.44–1.00)
GFR, Estimated: 25 mL/min — ABNORMAL LOW (ref >60)
GFR, Estimated: 27 mL/min — ABNORMAL LOW (ref >60)
Glucose, Bld: 108 mg/dL — ABNORMAL HIGH (ref 70–99)
Glucose, Bld: 99 mg/dL (ref 70–99)
Potassium: 4.2 mmol/L (ref 3.5–5.1)
Potassium: 4.1 mmol/L (ref 3.5–5.1)
Sodium: 135 mmol/L (ref 135–145)
Sodium: 137 mmol/L (ref 135–145)
Total Bilirubin: 23.1 mg/dL (ref 0.3–1.2)
Total Bilirubin: 22.5 mg/dL (ref 0.3–1.2)
Total Protein: 7.5 g/dL (ref 6.5–8.1)
Total Protein: 7.6 g/dL (ref 6.5–8.1)

## 2020-10-19 LAB — AMMONIA: Ammonia: 36 umol/L — ABNORMAL HIGH (ref 9–35)

## 2020-10-19 LAB — RESP PANEL BY RT-PCR (FLU A&B, COVID) ARPGX2
Influenza A by PCR: NEGATIVE
Influenza B by PCR: NEGATIVE
SARS Coronavirus 2 by RT PCR: NEGATIVE

## 2020-10-19 LAB — LIPASE, BLOOD: Lipase: 24 U/L (ref 11–51)

## 2020-10-19 MED ORDER — BIOTENE DRY MOUTH MT LIQD
15.0000 mL | Freq: Two times a day (BID) | OROMUCOSAL | Status: DC
  Administered 2020-10-19 – 2020-10-22 (×5): 15 mL via TOPICAL

## 2020-10-19 MED ORDER — LORAZEPAM 1 MG PO TABS
1.0000 mg | ORAL_TABLET | ORAL | Status: DC | PRN
  Administered 2020-10-19 – 2020-10-21 (×2): 1 mg via ORAL
  Filled 2020-10-19 (×2): qty 1

## 2020-10-19 MED ORDER — ONDANSETRON HCL 4 MG/2ML IJ SOLN: 4.0000 mg | Freq: Four times a day (QID) | INTRAMUSCULAR | Status: DC | PRN

## 2020-10-19 MED ORDER — ONDANSETRON 4 MG PO TBDP: 4.0000 mg | ORAL_TABLET | Freq: Four times a day (QID) | ORAL | Status: DC | PRN

## 2020-10-19 MED ORDER — ISOSORBIDE MONONITRATE ER 30 MG PO TB24
30.0000 mg | ORAL_TABLET | Freq: Every day | ORAL | Status: DC
  Administered 2020-10-19 – 2020-10-22 (×3): 30 mg via ORAL
  Filled 2020-10-19 (×4): qty 1

## 2020-10-19 MED ORDER — PRAVASTATIN SODIUM 40 MG PO TABS
40.0000 mg | ORAL_TABLET | Freq: Every day | ORAL | Status: DC
  Administered 2020-10-19: 40 mg via ORAL
  Filled 2020-10-19: qty 1

## 2020-10-19 MED ORDER — GLYCOPYRROLATE 0.2 MG/ML IJ SOLN: 0.2000 mg | INTRAMUSCULAR | Status: DC | PRN

## 2020-10-19 MED ORDER — HEPARIN SODIUM (PORCINE) 5000 UNIT/ML IJ SOLN
5000.0000 [IU] | Freq: Three times a day (TID) | INTRAMUSCULAR | Status: DC
  Administered 2020-10-19: 5000 [IU] via SUBCUTANEOUS
  Filled 2020-10-19: qty 1

## 2020-10-19 MED ORDER — HALOPERIDOL LACTATE 5 MG/ML IJ SOLN
2.0000 mg | Freq: Four times a day (QID) | INTRAMUSCULAR | Status: DC | PRN
Start: 2020-10-19 — End: 2020-10-22

## 2020-10-19 MED ORDER — LACTATED RINGERS IV SOLN: INTRAVENOUS | Status: DC

## 2020-10-19 MED ORDER — SENNOSIDES-DOCUSATE SODIUM 8.6-50 MG PO TABS: 1.0000 | ORAL_TABLET | Freq: Every evening | ORAL | Status: DC | PRN

## 2020-10-19 MED ORDER — HALOPERIDOL LACTATE 2 MG/ML PO CONC
2.0000 mg | Freq: Four times a day (QID) | ORAL | Status: DC | PRN
Start: 2020-10-19 — End: 2020-10-22
  Filled 2020-10-19: qty 1

## 2020-10-19 MED ORDER — ASPIRIN 81 MG PO CHEW
81.0000 mg | CHEWABLE_TABLET | Freq: Every day | ORAL | Status: DC
  Administered 2020-10-19: 81 mg via ORAL
  Filled 2020-10-19: qty 1

## 2020-10-19 MED ORDER — HALOPERIDOL 1 MG PO TABS
2.0000 mg | ORAL_TABLET | Freq: Four times a day (QID) | ORAL | Status: DC | PRN
  Filled 2020-10-19: qty 2

## 2020-10-19 MED ORDER — LORAZEPAM 2 MG/ML PO CONC: 1.0000 mg | ORAL | Status: DC | PRN

## 2020-10-19 MED ORDER — LORAZEPAM 2 MG/ML IJ SOLN: 1.0000 mg | INTRAMUSCULAR | Status: DC | PRN

## 2020-10-19 MED ORDER — POLYVINYL ALCOHOL 1.4 % OP SOLN
1.0000 [drp] | Freq: Four times a day (QID) | OPHTHALMIC | Status: DC | PRN
  Filled 2020-10-19: qty 15

## 2020-10-19 MED ORDER — GLYCOPYRROLATE 1 MG PO TABS
1.0000 mg | ORAL_TABLET | ORAL | Status: DC | PRN
  Filled 2020-10-19: qty 1

## 2020-10-19 MED ORDER — FENTANYL CITRATE (PF) 100 MCG/2ML IJ SOLN: 25.0000 ug | INTRAMUSCULAR | Status: DC | PRN

## 2020-10-19 NOTE — ED Notes (Signed)
covid swab sent to lab

## 2020-10-19 NOTE — Progress Notes (Signed)
PROGRESS NOTE    Jasmine Lin  YJE:[id number] DOB: [birth year] DOA: 10/18/2020 PCP: Wilber Bihari, PA   Brief Narrative: 87 year old with past medical history significant for large B-cell lymphoma with metastasis as well as gallbladder malignancy presented via EMS due to family concern for weakness.  Patient is not able to provide history.  Per family patient has not been eating or drinking over the last 4 days.  EMS evaluation showed patient had low blood pressure and received IV fluids.  Patient has not received any intervention or treatment for her malignancy recently.  Evaluation in the ED patient was found to have hyperbilirubinemia, transaminases, AKI.    Assessment & Plan:   Principal Problem:   AKI (acute kidney injury) ([place]) Active Problems:   Jaundice   Generalized weakness   Failure to thrive in adult  1-AKI: Secondary to poor oral intake, hypotension. Treated with IV fluids. Family has decided to transition to full comfort care.  2-Transaminases, hyperbilirubinemia in the setting of known gallbladder cancer; Diagnosed by Biopsy and Pet scan at [facility].  -Ultrasound no dilation of the common bile duct dilation. Cholelithiasis, no obstruction. [place] is not best test to identified gallbladder malignancy.  -Palliative met with family, plan is for full comfort care.  -Plan for residential hospice facility.   3-FTT, generalized weakness.  Secondary to malignancy.       Estimated body mass index is 23.82 kg/m as calculated from the following:   Height as of 08/01/19: $RemoveBefo'5\' 3"'NontKWegsNI$  (1.6 m).   Weight as of 08/01/19: 61 kg.   DVT prophylaxis: Heparin  Code Status: DNR Family Communication: Disposition Plan:  Status is: Inpatient  Remains inpatient appropriate because:IV treatments appropriate due to intensity of illness or inability to take PO   Dispo: The patient is from: Home              Anticipated d/c is to: to be determine              Patient currently is  not medically stable to d/c.   Difficult to place patient No        Consultants:   Palliative care   Procedures:   None  Antimicrobials:    Subjective: Does not wants to eat. Voice weak when she answer questions  Objective: Vitals:   10/19/20 0745 10/19/20 0904 10/19/20 0930 10/19/20 1041  BP: (!) 110/59  (!) 143/81 136/62  Pulse: 75  84 80  Resp: $Remo'18  18 16  'tbqdy$ Temp:  98.7 F (37.1 C)  98.6 F (37 C)  TempSrc:  Oral  Oral  SpO2: 97%  97% 95%    Intake/Output Summary (Last 24 hours) at 10/19/2020 1408 Last data filed at 10/19/2020 0055 Gross per 24 hour  Intake 500 ml  Output --  Net 500 ml   There were no vitals filed for this visit.  Examination:  General exam: Icteric , cachetic Respiratory system: Clear to auscultation. Respiratory effort normal. Cardiovascular system: S1 & S2 heard, RRR. No JVD, murmurs, rubs, gallops or clicks. No pedal edema. Gastrointestinal system: Abdomen is nondistended, soft and nontender. Central nervous system: Alert  Extremities: Symmetric 5 x 5 power.   Data Reviewed: I have personally reviewed following labs and imaging studies  CBC: Recent Labs  Lab 10/18/20 2307 10/19/20 0832  WBC 6.8 7.4  NEUTROABS 5.1  --   HGB 9.4* 9.7*  HCT 26.9* 27.1*  MCV 93.4 93.1  PLT 281 637   Basic Metabolic Panel: Recent Labs  Lab 10/18/20 2307 10/19/20 0832  NA 135 137  K 4.2 4.1  CL 105 108  CO2 20* 19*  GLUCOSE 108* 99  BUN 64* 65*  CREATININE 1.92* 1.81*  CALCIUM 9.1 9.0   GFR: CrCl cannot be calculated (Unknown ideal weight.). Liver Function Tests: Recent Labs  Lab 10/18/20 2307 10/19/20 0832  AST 173* 162*  ALT 101* 97*  ALKPHOS 366* 363*  BILITOT 23.1* 22.5*  PROT 7.5 7.6  ALBUMIN 1.8* 1.7*   Recent Labs  Lab 10/18/20 2307  LIPASE 24   Recent Labs  Lab 10/19/20 0832  AMMONIA 36*   Coagulation Profile: Recent Labs  Lab 10/18/20 2307  INR 1.4*   Cardiac Enzymes: No results for input(s):  CKTOTAL, CKMB, CKMBINDEX, TROPONINI in the last 168 hours. BNP (last 3 results) No results for input(s): PROBNP in the last 8760 hours. HbA1C: No results for input(s): HGBA1C in the last 72 hours. CBG: No results for input(s): GLUCAP in the last 168 hours. Lipid Profile: No results for input(s): CHOL, HDL, LDLCALC, TRIG, CHOLHDL, LDLDIRECT in the last 72 hours. Thyroid Function Tests: No results for input(s): TSH, T4TOTAL, FREET4, T3FREE, THYROIDAB in the last 72 hours. Anemia Panel: No results for input(s): VITAMINB12, FOLATE, FERRITIN, TIBC, IRON, RETICCTPCT in the last 72 hours. Sepsis Labs: No results for input(s): PROCALCITON, LATICACIDVEN in the last 168 hours.  Recent Results (from the past 240 hour(s))  Resp Panel by RT-PCR (Flu A&B, Covid) Nasopharyngeal Swab     Status: None   Collection Time: 10/19/20  6:21 AM   Specimen: Nasopharyngeal Swab; Nasopharyngeal(NP) swabs in vial transport medium  Result Value Ref Range Status   SARS Coronavirus 2 by RT PCR NEGATIVE NEGATIVE Final    Comment: (NOTE) SARS-CoV-2 target nucleic acids are NOT DETECTED.  The SARS-CoV-2 RNA is generally detectable in upper respiratory specimens during the acute phase of infection. The lowest concentration of SARS-CoV-2 viral copies this assay can detect is 138 copies/mL. A negative result does not preclude SARS-Cov-2 infection and should not be used as the sole basis for treatment or other patient management decisions. A negative result may occur with  improper specimen collection/handling, submission of specimen other than nasopharyngeal swab, presence of viral mutation(s) within the areas targeted by this assay, and inadequate number of viral copies(<138 copies/mL). A negative result must be combined with clinical observations, patient history, and epidemiological information. The expected result is Negative.  Fact Sheet for Patients:  EntrepreneurPulse.com.au  Fact Sheet  for Healthcare Providers:  IncredibleEmployment.be  This test is no t yet approved or cleared by the [place] FDA and  has been authorized for detection and/or diagnosis of SARS-CoV-2 by FDA under an Emergency Use Authorization (EUA). This EUA will remain  in effect (meaning this test can be used) for the duration of the COVID-19 declaration under Section 564(b)(1) of the Act, 21 U.S.C.section 360bbb-3(b)(1), unless the authorization is terminated  or revoked sooner.       Influenza A by PCR NEGATIVE NEGATIVE Final   Influenza B by PCR NEGATIVE NEGATIVE Final    Comment: (NOTE) The Xpert Xpress SARS-CoV-2/FLU/RSV plus assay is intended as an aid in the diagnosis of influenza from Nasopharyngeal swab specimens and should not be used as a sole basis for treatment. Nasal washings and aspirates are unacceptable for Xpert Xpress SARS-CoV-2/FLU/RSV testing.  Fact Sheet for Patients: EntrepreneurPulse.com.au  Fact Sheet for Healthcare Providers: IncredibleEmployment.be  This test is not yet approved or cleared by the [place] FDA  and has been authorized for detection and/or diagnosis of SARS-CoV-2 by FDA under an Emergency Use Authorization (EUA). This EUA will remain in effect (meaning this test can be used) for the duration of the COVID-19 declaration under Section 564(b)(1) of the Act, 21 U.S.C. section 360bbb-3(b)(1), unless the authorization is terminated or revoked.  Performed at [facility], [place] [address] [place], [place] [address]          Radiology Studies: US Abdomen Limited RUQ (LIVER/GB)  Result Date: 10/19/2020 CLINICAL DATA:  Abnormal liver enzymes. EXAM: ULTRASOUND ABDOMEN LIMITED RIGHT UPPER QUADRANT COMPARISON:  None. FINDINGS: Gallbladder: Cholelithiasis is identified. No wall thickening, pericholecystic fluid, or Murphy's sign. The patient's reported gallbladder cancer is not clearly  visualized on today's imaging. Common bile duct: Diameter: 2.3 mm Liver: Diffusely heterogeneous with no focal mass. Portal vein is patent on color Doppler imaging with normal direction of blood flow towards the liver. Other: None. IMPRESSION: 1. Cholelithiasis. No wall thickening, pericholecystic fluid, or Murphy's sign. The patient's reported gallbladder cancer is not visualized on today's imaging. 2. The echotexture of the liver is diffusely heterogeneous without focal mass, nonspecific. Electronically Signed   By: Dorise Bullion III M.D   On: 10/19/2020 12:00        Scheduled Meds: . aspirin  81 mg Oral Daily  . heparin  5,000 Units Subcutaneous Q8H  . isosorbide mononitrate  30 mg Oral Daily  . pravastatin  40 mg Oral Daily   Continuous Infusions: . lactated ringers 100 mL/hr at 10/19/20 0827  . lactated ringers 75 mL/hr at 10/19/20 1052     LOS: 0 days    Time spent: 35 minutes    Juley Giovanetti A Nicholai Willette, MD [facility]   If 7PM-7AM, please contact night-coverage [email or web address]  10/19/2020, 2:08 PM

## 2020-10-19 NOTE — Care Management (Addendum)
Received request to follow up with patient's son regarding hospice choice. Multiple attempts to reach Lolita Cram [phone]. No answer and voicemail not set up. Unable to leave message.  AddendumMartin Majestic to bedside. Son not present. Spoke with nursing. No other telephone numbers on file.    Marthenia Rolling, MSN, RN,BSN [facility]patient [facility] Case Manager [phone]

## 2020-10-19 NOTE — Consult Note (Signed)
Consultation Note Date: 10/19/2020   Patient Name: Jasmine Lin  DOB: 03-06-1934  MRN: [id number]  Age / Sex: 87 y.o., female  PCP: Jasmine Bihari, PA Referring Physician: Chotiner, Jasmine Aline, MD  Reason for Consultation: Establishing goals of care, "failure to thrive, hx of lymphoma and gallbladder cancer"  HPI/Patient Profile: 87 y.o. female  with past medical history of hypertension, large B-cell lymphoma with metastasis as well as gallbladder malignancy presented to the ED on 10/18/20 from home with family concerns of no PO intake and generalized weakness x3 days. Patient was admitted on 10/18/2020 with transaminases/hyperbilirubinemia in setting of known gallbladder cancer, AKI, and failure to thrive.  Clinical Assessment and Goals of Care: I have reviewed medical records including EPIC notes, labs, and imaging. Received report from primary RN - no acute concerns. Reports patient refused breakfast this morning.   Went to visit patient at bedside - no family/visitors present. Patient was lying in bed awake, alert, oriented x1 (self only), and unable to participate in conversation/complex medical decision making. No signs or non-verbal gestures of pain or discomfort noted. No respiratory distress, increased work of breathing, or secretions noted. Patient denies pain and can state her name, but does not answer any other questions appropriately.   Met with son/Jasmine via phone to discuss diagnosis, prognosis, GOC, EOL wishes, disposition, and options.  I introduced Palliative Medicine as specialized medical care for people living with serious illness. It focuses on providing relief from the symptoms and stress of a serious illness. The goal is to improve quality of life for both the patient and the family.  We discussed a brief life review of the patient as well as functional and nutritional status. Ms.  Jasmine Lin was never married, but did have 5 sons. For the last 4.5 years she has lived in a private residence with Jasmine Lin. Patient was first diagnosed with cancer in 2018 - in Spring 2020 she underwent chemotherapy and in Fall 2021 she did radiation treatments. Per Jasmine Lin, neither of these treatments were successful and the patient's oncologist recommended hospice care at that time as there were no other recommended treatment options. The patient/family did not immediately purse hospice, but Jasmine Lin states that he has recognized a decline in the patient over the last 2-3 weeks and feels it is now time for their support. Prior to 3 weeks ago, the patient was functionally independent. Now, the patient is not able to "get up and go" as usual (needs assistance with ambulation), is increasingly sleepy, and has had a poor appetite (only drinking ensure with minimal sips of water); however, Jasmine Lin states patient has had no intake over the last several days.   We discussed patient's current illness and what it means in the larger context of patient's on-going co-morbidities.  Jasmine Lin clearly understands the patient's current acute medical situation. Natural disease trajectory and expectations at EOL were discussed. I attempted to elicit values and goals of care important to the patient. The difference between aggressive medical intervention and comfort care was considered in  light of the patient's goals of care. Jasmine Lin states that he is not interested in pursuing aggressive interventions at this time - he understands that her cancer is not curable and his goal is to make her comfortable for the time she has left. We talked about transition to comfort measures in house and what that would entail inclusive of medications to control pain, dyspnea, agitation, nausea, itching, and hiccups. We discussed stopping all unnecessary measures such as blood draws, needle sticks, oxygen, antibiotics, CBGs/insulin, cardiac monitoring,  and frequent vital signs. Jasmine Lin was agreeable for transition to full comfort measures in house today.  Hospice philosophy/services were explained and offered, including home hospice vs residential hospice. Prognosis was reviewed in consideration that the patient is not eating or drinking, that she likely has <2 weeks - Jasmine Lin expressed understanding and was not surprised to hear this after watching her decline.  Advance directives, concepts specific to code status, artificial feeding and hydration were considered and discussed. Jasmine Lin states the patient does not have a Living Will or HCPOA. He does state that his 4 brothers have dedicated him the "family manager" of the patient's care. He keeps family informed but they defer to him to make decisions.   Reviewed [facility] EOL visitation policy - Jasmine Lin expressed understanding and was appreciative.   Visit also consisted of discussions dealing with the complex and emotionally intense issues of symptom management and palliative care in the setting of serious and life-threatening illness. Palliative care team will continue to support patient, patient's family, and medical team.  Discussed with Jasmine Lin the importance of continued conversation with family and the medical providers regarding overall plan of care and treatment options, ensuring decisions are within the context of the patient's values and GOCs.    Questions and concerns were addressed. The patient/family was encouraged to call with questions and/or concerns. PMT card was provided.  Primary Decision Maker: NEXT OF KIN - son/Jasmine Lin    SUMMARY OF RECOMMENDATIONS: . Initiated full comfort measures - prognosis likely <2 weeks . Continue DNR/DNI as previously documented . Evaluate for residential hospice - family request [employer]. TOC and [facility] liaison notified; TOC consult placed . Added orders for EOL symptom management and to reflect full comfort measures, as well as  discontinued orders that were not focused on comfort . Unrestricted visitation orders were placed per current [place] EOL visitation policy  . Nursing to provide frequent assessments and administer PRN medications as clinically necessary to ensure EOL comfort . PMT will continue to follow holistically   Code Status/Advance Care Planning:  DNR  Palliative Prophylaxis:   Aspiration, Bowel Regimen, Delirium Protocol, Frequent Pain Assessment, Oral Care and Turn Reposition  Additional Recommendations (Limitations, Scope, Preferences):  Full Comfort Care  Psycho-social/Spiritual:   Desire for further Chaplaincy support:no Created space and opportunity for patient and family to express thoughts and feelings regarding patient's current medical situation.   Emotional support and therapeutic listening provided.  Prognosis:   < 2 weeks  Discharge Planning: Hospice facility      Primary Diagnoses: Present on Admission: . AKI (acute kidney injury) ([place])   I have reviewed the medical record, interviewed the patient and family, and examined the patient. The following aspects are pertinent.  Past Medical History:  Diagnosis Date  . Cancer ([place])   . Hypertension    Social History   Socioeconomic History  . Marital status: Single    Spouse name: Not on file  . Number of children: Not  on file  . Years of education: Not on file  . Highest education level: Not on file  Occupational History  . Not on file  Tobacco Use  . Smoking status: Never Smoker  . Smokeless tobacco: Never Used  Substance and Sexual Activity  . Alcohol use: Never  . Drug use: Never  . Sexual activity: Not on file  Other Topics Concern  . Not on file  Social History Narrative  . Not on file   Social Determinants of Health   Financial Resource Strain: Not on file  Food Insecurity: Not on file  Transportation Needs: Not on file  Physical Activity: Not on file  Stress: Not on file   Social Connections: Not on file   History reviewed. No pertinent family history. Scheduled Meds: . antiseptic oral rinse  15 mL Topical BID  . isosorbide mononitrate  30 mg Oral Daily   Continuous Infusions: PRN Meds:.fentaNYL (SUBLIMAZE) injection, glycopyrrolate **OR** glycopyrrolate **OR** glycopyrrolate, haloperidol **OR** haloperidol **OR** haloperidol lactate, LORazepam **OR** LORazepam **OR** LORazepam, ondansetron **OR** ondansetron (ZOFRAN) IV, polyvinyl alcohol, senna-docusate Medications Prior to Admission:  Prior to Admission medications   Medication Sig Start Date End Date Taking? Authorizing Provider  aspirin 81 MG chewable tablet Chew 81 mg by mouth daily. 12/26/12  Yes [provider]  isosorbide mononitrate (IMDUR) 30 MG 24 hr tablet Take 30 mg by mouth daily. 10/07/20  Yes [provider]  lisinopril (ZESTRIL) 20 MG tablet Take 20 mg by mouth daily. 09/11/14  Yes [provider]  meclizine (ANTIVERT) 12.5 MG tablet Take 1 tablet (12.5 mg total) by mouth 2 (two) times daily as needed for dizziness. 04/18/19  Yes Jaynee Eagles, PA-C  pravastatin (PRAVACHOL) 40 MG tablet Take 40 mg by mouth daily. 08/16/20  Yes [provider]  traMADol (ULTRAM) 50 MG tablet Take 50 mg by mouth every 8 (eight) hours as needed. 10/11/20  Yes [provider]   Allergies  Allergen Reactions  . Codeine Hives, Itching and Swelling  . Hydrocodone-Acetaminophen Hives    She she developed urticaria over her forehead and chest just a few min after receiving 1 dose of Norco 5/325.   She she developed urticaria over her forehead and chest just a few min after receiving 1 dose of Norco 5/325.   She she developed urticaria over her forehead and chest just a few min after receiving 1 dose of Norco 5/325.     Marland Kitchen Diclofenac Other (See Comments)    Dizziness  . Hydrochlorothiazide Other (See Comments)    Dizziness   Review of Systems  Unable to perform ROS: Acuity of  condition    Physical Exam Vitals and nursing note reviewed.  Constitutional:      General: She is not in acute distress. Pulmonary:     Effort: No respiratory distress.  Skin:    General: Skin is warm and dry.     Coloration: Skin is jaundiced.  Neurological:     Mental Status: She is alert. She is disoriented and confused.     Motor: Weakness present.  Psychiatric:        Behavior: Behavior is cooperative.        Cognition and Memory: Cognition is impaired. Memory is impaired.     Vital Signs: BP 136/62 (BP Location: Right Arm)   Pulse 80   Temp 98.6 F (37 C) (Oral)   Resp 16   SpO2 95%  Pain Scale: 0-10   Pain Score: 2  SpO2: SpO2: 95 % O2 Device:SpO2: 95 % O2 Flow Rate: .   IO: Intake/output summary:   Intake/Output Summary (Last 24 hours) at 10/19/2020 1433 Last data filed at 10/19/2020 0055 Gross per 24 hour  Intake 500 ml  Output -  Net 500 ml    LBM:   Baseline Weight:   Most recent weight:       Palliative Assessment/Data: PPS 10%     Time In: 1330 Time Out: 1445 Time Total: 75 minutes  Greater than 50%  of this time was spent counseling and coordinating care related to the above assessment and plan.  Signed by: Lin Landsman, NP   Please contact Palliative Medicine Team phone at [phone] for questions and concerns.  For individual provider: See Shea Evans

## 2020-10-19 NOTE — ED Notes (Signed)
Dr Regalado at bedside.  

## 2020-10-19 NOTE — ED Notes (Signed)
The pt keeps asking for her son and family   She has been told that her family did not come and the edp talked to the son more than once and that he is at home and will not be coming until later this am

## 2020-10-19 NOTE — Progress Notes (Signed)
PT Cancellation Note  Patient Details Name: Jasmine Lin MRN: [id number] DOB: Oct 24, 1933   Cancelled Treatment:    Reason Eval/Treat Not Completed: Other (comment) per RN and MD, patient now moving towards comfort care/likely DC to hospice house. Medical team ok with PT signing off. Thank you for the opportunity to participate in her care!    Windell Norfolk, DPT, PN1   Supplemental Physical Therapist [facility]    Pager [phone] Acute Rehab Office [phone]

## 2020-10-19 NOTE — ED Provider Notes (Signed)
Received the patient in signout from Dr. Vanita Panda, briefly the patient is a 87 year old female with B-cell lymphoma and gallbladder cancer with failure to thrive.  Plan to await laboratory evaluation and admit to the hospital for comfort care and palliative consult hopefully in the morning.  Lab work is returned and the patient has a significant elevation of her total bilirubin.  Consistent with her physical exam.  Discussed with the hospitalist.   Deno Etienne, DO 10/19/20 [phone]

## 2020-10-19 NOTE — ED Notes (Signed)
Pt awakened after sleeping several hours  .Marland Kitchen  We moved her to a regular hospital bed for comdfort  The pt has not voided since she arrived..  Diaper dry

## 2020-10-19 NOTE — Progress Notes (Signed)
Manufacturing engineer [facility])  Referral received for residential hospice at [facility].  Several attempts to reach son to verify, currently not in the room and not answering phone with no option to leave VM.  Once son verifies that they would like [facility] for EOL care, I will have to get approval from my attending prior to being able to offer a room.  Thank you, Venia Carbon RN, BSN, [facility] Liaison

## 2020-10-19 NOTE — ED Notes (Signed)
Message sent to Dr Tyrell Antonio regarding bili level, attempted to call report to floor, nurse to call me back

## 2020-10-19 NOTE — H&P (Signed)
History and Physical    Jasmine Lin GXQ:[id number] DOB: 1934-01-16 DOA: 10/18/2020  PCP: Wilber Bihari, PA   Patient coming from: Home  Chief Complaint: Weakness and not eating past few days  HPI: Jasmine Lin is a 87 y.o. female with medical history significant for  large B-cell lymphoma with metastases as well as gallbladder malignancy presents via EMS due to family concerns of weakness. Jasmine Lin is not a good historian and just says she is weak.  EMS reports the family became concerned as the patient was eating and drinking less over the past  4 days. EMS found her to have low BP and gave her IVF with improvement in blood pressure. ER physician reports that he spoke to the son who stated his mother has not received interventions for her malignancy got the past month and possibly longer.  She is scheduled to follow-up with her oncologist in a few days.  He was open to speaking with palliative care with her cancer team, but had not yet done so.  However, the patient was designated as DNR by the patient's son and his brother. Son notes that over the past week the patient has had progressive weakness, increasing confusion.  He is open to palliative care consult if she requires hospitalization here.  ED Course:  She is found to have AKI and jaundice with elevated bilirubin in the ER. She has been given IVF. Hospitalist service asked to admit for further management  Review of Systems:  General: Reports weakness and poor appetite. Denies fever, chills, weight loss, night sweats.  Denies dizziness HENT: Denies head trauma, headache, denies change in hearing, tinnitus.  Denies nasal congestion or bleeding.  Denies sore throat, sores in mouth.  Denies difficulty swallowing Eyes: Denies blurry vision, pain in eye, drainage.  Has discoloration of eyes. Neck: Denies pain.  Denies swelling.  Denies pain with movement. Cardiovascular: Denies chest pain, palpitations.  Denies edema.  Denies  orthopnea Respiratory: Denies shortness of breath, cough.  Denies wheezing.  Denies sputum production Gastrointestinal: Denies abdominal pain, swelling.  Denies nausea, vomiting, diarrhea.  Denies melena.  Denies hematemesis. Musculoskeletal: Denies limitation of movement.  Denies deformity or swelling.  Denies pain.  Denies arthralgias or myalgias. Genitourinary: Denies pelvic pain.  Denies urinary frequency or hesitancy.  Denies dysuria.  Skin: Has yellowing of skin. Denies rash.  Denies petechiae, purpura, ecchymosis. Neurological: Denies headache.  Denies syncope. Denies slurred speech, drooping face.  Denies visual change. Psychiatric: Denies depression, anxiety. Denies hallucinations.  Past Medical History:  Diagnosis Date   Cancer [facility])    Hypertension     History reviewed. No pertinent surgical history.  Social History  reports that she has never smoked. She has never used smokeless tobacco. She reports that she does not drink alcohol and does not use drugs.  Allergies  Allergen Reactions   Codeine Hives, Itching and Swelling   Hydrocodone-Acetaminophen Hives    She she developed urticaria over her forehead and chest just a few min after receiving 1 dose of Norco 5/325.   She she developed urticaria over her forehead and chest just a few min after receiving 1 dose of Norco 5/325.   She she developed urticaria over her forehead and chest just a few min after receiving 1 dose of Norco 5/325.      Diclofenac Other (See Comments)    Dizziness   Hydrochlorothiazide Other (See Comments)    Dizziness    History reviewed. No pertinent family history.  Prior to Admission medications   Medication Sig Start Date End Date Taking? Authorizing Provider  aspirin 81 MG chewable tablet Chew 81 mg by mouth daily. 12/26/12  Yes [provider]  isosorbide mononitrate (IMDUR) 30 MG 24 hr tablet Take 30 mg by mouth daily. 10/07/20  Yes [provider]  lisinopril  (ZESTRIL) 20 MG tablet Take 20 mg by mouth daily. 09/11/14  Yes [provider]  meclizine (ANTIVERT) 12.5 MG tablet Take 1 tablet (12.5 mg total) by mouth 2 (two) times daily as needed for dizziness. 04/18/19  Yes Jaynee Eagles, PA-C  pravastatin (PRAVACHOL) 40 MG tablet Take 40 mg by mouth daily. 08/16/20  Yes [provider]  traMADol (ULTRAM) 50 MG tablet Take 50 mg by mouth every 8 (eight) hours as needed. 10/11/20  Yes [provider]    Physical Exam: Vitals:   10/18/20 2230 10/18/20 2245 10/18/20 2300 10/18/20 2315  BP: 106/65 (!) 99/53 (!) 101/46   Pulse: 71 68 67   Resp:    18  Temp:      TempSrc:      SpO2: 100% 99% 99%     Constitutional: NAD, calm, comfortable Vitals:   10/18/20 2230 10/18/20 2245 10/18/20 2300 10/18/20 2315  BP: 106/65 (!) 99/53 (!) 101/46   Pulse: 71 68 67   Resp:    18  Temp:      TempSrc:      SpO2: 100% 99% 99%    General: WDWN, Alert and oriented x3.  Eyes: EOMI, PERRL, conjunctivae normal.  Sclera icteric HENT:  Jasmine Lin/AT, external ears normal. Nares patent without epistasis.  Mucous membranes are dry Neck: Soft, normal range of motion, supple, no masses, no thyromegaly. Trachea midline Respiratory: clear to auscultation bilaterally, no wheezing, no crackles. Normal respiratory effort. No accessory muscle use.  Cardiovascular: Regular rate and rhythm, no murmurs / rubs / gallops. No extremity edema.  Abdomen: Soft, no tenderness, nondistended, no rebound or guarding.  No masses palpated. Bowel sounds normoactive Musculoskeletal: FROM. no cyanosis. No joint deformity upper and lower extremities. Normal muscle tone.  Skin: Jaundice. Warm, dry, intact no rashes, lesions, ulcers. No induration Neurologic: CN 2-12 grossly intact.  Normal speech.  Sensation intact Psychiatric: Normal mood.    Labs on Admission: I have personally reviewed following labs and imaging studies  CBC: Recent Labs  Lab 10/18/20 2307  WBC 6.8   NEUTROABS 5.1  HGB 9.4*  HCT 26.9*  MCV 93.4  PLT 588    Basic Metabolic Panel: Recent Labs  Lab 10/18/20 2307  NA 135  K 4.2  CL 105  CO2 20*  GLUCOSE 108*  BUN 64*  CREATININE 1.92*  CALCIUM 9.1    GFR: CrCl cannot be calculated (Unknown ideal weight.).  Liver Function Tests: Recent Labs  Lab 10/18/20 2307  AST 173*  ALT 101*  ALKPHOS 366*  BILITOT 23.1*  PROT 7.5  ALBUMIN 1.8*    Urine analysis:    Component Value Date/Time   LABSPEC 1.025 04/18/2019 1522   PHURINE 7.0 04/18/2019 1522   GLUCOSEU NEGATIVE 04/18/2019 1522   HGBUR TRACE (A) 04/18/2019 1522   BILIRUBINUR NEGATIVE 04/18/2019 1522   KETONESUR NEGATIVE 04/18/2019 1522   PROTEINUR NEGATIVE 04/18/2019 1522   UROBILINOGEN 0.2 04/18/2019 1522   NITRITE NEGATIVE 04/18/2019 1522   LEUKOCYTESUR NEGATIVE 04/18/2019 1522    Radiological Exams on Admission: No results found.  Assessment/Plan Principal Problem:   AKI (acute kidney injury)  Ms. Lecount with AKI with  increase in creatinine and BUN on labs. Most likely secondary to poor po intake leading to mild dehydration.  IVF hydration with LR provided.  Recheck renal function and electrolytes in am.  Active Problems:   Jaundice Pt with elevated bilirubin level with obvious jaundice. Has known gallbladder cancer. LFT's elevated. Palliative care consult placed to have discussion of long term plan    Generalized weakness Consult PT for evaluation. Palliative care consult    Failure to thrive in adult Consult dietician for nutrition evaluation    DVT prophylaxis: Heparin for DVT prophylaxis.  [place] score elevated Code Status:   DNR Family Communication:  Diagnosis and plan discussed with patient.  She agrees with plan.  Further recommendations to follow as clinically indicated Disposition Plan:   Patient is from:  Home  Anticipated DC to:  Home versus skilled nursing facility, hospice which will be determined during       hospitalization  Anticipated DC date:  Anticipate more than 2 midnight stay  Anticipated DC barriers: Barriers to discharge would be finding SNF or hospice bed if needed  Consults:                   Palliative care consult order placed  Admission status:  Inpatient  Yevonne Aline Annalicia Renfrew MD [facility]  How to contact the [facility] Attending or Consulting provider [place] or covering provider during after hours 7P -7A, for this patient?   1. Check the care team in [facility] and look for a) attending/consulting [facility] provider listed and b) the [facility] team listed 2. Log into [email or web address] and use 's universal password to access. If you do not have the password, please contact the hospital operator. 3. Locate the [facility] provider you are looking for under [facility] and page to a number that you can be directly reached. 4. If you still have difficulty reaching the provider, please page the [facility] (Director on Call) for the Hospitalists listed on amion for assistance.  10/19/2020, 2:16 AM

## 2020-10-19 NOTE — ED Notes (Signed)
IV clarification - double charted.  LR is infusing at 75cc/ hr per orders to RFA

## 2020-10-19 NOTE — ED Notes (Signed)
Transferred to floor via EDT Otila Kluver

## 2020-10-20 NOTE — Progress Notes (Signed)
Daily Progress Note   Patient Name: Jasmine Lin       Date: 10/20/2020 DOB: 08-07-1934  Age: 87 y.o. MRN#: [id number] Attending Physician: Elmarie Shiley, MD Primary Care Physician: Wilber Bihari, [place] Admit Date: 10/18/2020  Reason for Consultation/Follow-up: Non pain symptom management, Pain control, Psychosocial/spiritual support and Terminal Care  Subjective: Chart review performed. Received report from primary RN - no acute concerns. RN reports patient did not eat breakfast or accept ensure.   Went to visit patient at bedside - several family members present including sister, 2 great-nieces, and 2 granddaughters. Patient was lying in bed - she does not open her eyes but does verbally respond to voice/gentle touch - her speech remains soft and unintelligible. No signs or non-verbal gestures of pain or discomfort noted. No respiratory distress, increased work of breathing, or secretions noted.   Therapeutic listening and emotional support provided to family. Education provided on natural trajectory at EOL. Reviewed that residential hospice evaluation is still pending. Prognostication reviewed per family request. Family will inform son/Raymond of my visit today.  All questions and concerns addressed. Encouraged to call with questions and/or concerns. PMT card provided.   Family express appreciation for PMT visit today.  Family have created a very peaceful environment for patient.    Length of Stay: 1  Current Medications: Scheduled Meds:  . antiseptic oral rinse  15 mL Topical BID  . isosorbide mononitrate  30 mg Oral Daily    Continuous Infusions:   PRN Meds: fentaNYL (SUBLIMAZE) injection, glycopyrrolate **OR** glycopyrrolate **OR** glycopyrrolate, haloperidol  **OR** haloperidol **OR** haloperidol lactate, LORazepam **OR** LORazepam **OR** LORazepam, ondansetron **OR** ondansetron (ZOFRAN) IV, polyvinyl alcohol, senna-docusate  Physical Exam Vitals and nursing note reviewed.  Constitutional:      General: She is not in acute distress.    Appearance: She is ill-appearing.  Pulmonary:     Effort: No respiratory distress.  Skin:    General: Skin is warm and dry.  Neurological:     Mental Status: She is lethargic, disoriented and confused.     Motor: Weakness present.  Psychiatric:        Speech: Speech is slurred.        Cognition and Memory: Cognition is impaired. Memory is impaired.  Vital Signs: BP 120/77 (BP Location: Right Arm)   Pulse 65   Temp 98.5 F (36.9 C) (Oral)   Resp 15   SpO2 97%  SpO2: SpO2: 97 % O2 Device: O2 Device: Room Air O2 Flow Rate:    Intake/output summary: No intake or output data in the 24 hours ending 10/20/20 1146 LBM:   Baseline Weight:   Most recent weight:         Palliative Assessment/Data: PPS 10-20%      Patient Active Problem List   Diagnosis Date Noted  . AKI (acute kidney injury) ([place]) 10/19/2020  . Jaundice 10/19/2020  . Generalized weakness 10/19/2020  . Failure to thrive in adult 10/19/2020    Palliative Care Assessment & Plan   Patient Profile: 87 y.o. female  with past medical history of hypertension, large B-cell lymphoma with metastasis as well as gallbladder malignancy presented to the ED on 10/18/20 from home with family concerns of no PO intake and generalized weakness x3 days. Patient was admitted on 10/18/2020 with transaminases/hyperbilirubinemia in setting of known gallbladder cancer, AKI, and failure to thrive.  Assessment: AKI Jaundice Generalized weakness Failure to thrive in adult Gallbladder cancer  Recommendations/Plan:  Continue full comfort measures  Continue DNR/DNI as previously documented  Transfer to [facility] when bed available  pending evaluation approval  Continue current comfort focused medication regimen - patient appears comfortable  Nursing to provide frequent assessments and administer PRN medications as clinically necessary to ensure EOL comfort  PMT will continue to follow and support holistically  Goals of Care and Additional Recommendations:  Limitations on Scope of Treatment: Full Comfort Care  Code Status:    Code Status Orders  (From admission, onward)         Start     Ordered   10/19/20 1423  Do not attempt resuscitation (DNR)  Continuous       Question Answer Comment  In the event of cardiac or respiratory ARREST Do not call a "code blue"   In the event of cardiac or respiratory ARREST Do not perform Intubation, CPR, defibrillation or ACLS   In the event of cardiac or respiratory ARREST Use medication by any route, position, wound care, and other measures to relive pain and suffering. May use oxygen, suction and manual treatment of airway obstruction as needed for comfort.      10/19/20 1431        Code Status History    Date Active Date Inactive Code Status Order ID Comments User Context   10/19/2020 0822 10/19/2020 1431 DNR [id number]  Chotiner, Yevonne Aline, MD ED   10/18/2020 2203 10/19/2020 4097 DNR [id number]  Carmin Muskrat, MD ED   Advance Care Planning Activity       Prognosis:   < 2 weeks  Discharge Planning:  Hospice facility  Care plan was discussed with primary RN, patient's family  Thank you for allowing the Palliative Medicine Team to assist in the care of this patient.   Total Time 15 minutes Prolonged Time Billed  no       Greater than 50%  of this time was spent counseling and coordinating care related to the above assessment and plan.  Lin Landsman, NP  Please contact Palliative Medicine Team phone at [phone] for questions and concerns.

## 2020-10-20 NOTE — Progress Notes (Signed)
Nutrition Brief Note  Chart reviewed. Pt now transitioning to comfort care.  No further nutrition interventions warranted at this time.  Please re-consult as needed.   Jozsef Wescoat, MS, RD, LDN RD pager number and weekend/on-call pager number located in Amion.    

## 2020-10-20 NOTE — Progress Notes (Addendum)
Manufacturing engineer [facility])  Referral received for residential hospice at [facility].   Met with son Jasmine Lin at the bedside, he is overwhelmed but in agreement for residential hospice.  He is aware that bed availability is pending approval by hospice MD and this has not occurred yet. Once hospice attending verifies that she is appropriate for [facility], this Probation officer will update Jasmine Lin.  Thank you, Venia Carbon RN, BSN, [facility] Liaison   **addendum @ 5 pm, pt is eligible for residential hospice at [facility]. There is not a bed to offer on Sunday. [facility] will update family and hospital staff once a bed it open for Jasmine Lin.

## 2020-10-20 NOTE — Progress Notes (Addendum)
PROGRESS NOTE    Jasmine Lin  HAL:[id number] DOB: 07/21/1934 DOA: 10/18/2020 PCP: Wilber Bihari, PA   Brief Narrative: 87 year old with past medical history significant for large B-cell lymphoma with metastasis as well as gallbladder malignancy presented via EMS due to family concern for weakness.  Patient is not able to provide history.  Per family patient has not been eating or drinking over the last 4 days.  EMS evaluation showed patient had low blood pressure and received IV fluids.  Patient has not received any intervention or treatment for her malignancy recently.  Evaluation in the ED patient was found to have hyperbilirubinemia, transaminases, AKI.    Assessment & Plan:   Principal Problem:   AKI (acute kidney injury) ([place]) Active Problems:   Jaundice   Generalized weakness   Failure to thrive in adult  1-AKI: Secondary to poor oral intake, hypotension. Treated with IV fluids. Family has decided to transition to full comfort care. Comfort care. Plan for residential   2-Transaminases, hyperbilirubinemia in the setting of known gallbladder cancer; Diagnosed by Biopsy and Pet scan at [facility].  -Ultrasound no dilation of the common bile duct dilation. Cholelithiasis, no obstruction. [place] is not best test to identified gallbladder malignancy.  -Palliative met with family, plan is for full comfort care.  -Plan for residential hospice facility.   3-FTT, generalized weakness.  Secondary to malignancy.       Estimated body mass index is 23.82 kg/m as calculated from the following:   Height as of 08/01/19: '5\' 3"'  (1.6 m).   Weight as of 08/01/19: 61 kg.   DVT prophylaxis: Heparin  Code Status: DNR Family Communication: Son and granddaughter at bedside.  Disposition Plan:  Status is: Inpatient  Remains inpatient appropriate because:IV treatments appropriate due to intensity of illness or inability to take PO   Dispo: The patient is from: Home               [facility]ticipated d[facility]c [facility]               Patient currently is not medically stable to d/c.   Difficult to place patient No        Consultants:   Palliative care   Procedures:   None  Antimicrobials:    Subjective: She is alert, with family at bedside.    Objective: Vitals:   10/19/20 0904 10/19/20 0930 10/19/20 1041 10/20/20 0127  BP:  (!) 143/81 136/62 120/77  Pulse:  84 80 65  Resp:  '18 16 15  ' Temp: 98.7 F (37.1 C)  98.6 F (37 C) 98.5 F (36.9 C)  TempSrc: Oral  Oral Oral  SpO2:  97% 95% 97%   No intake or output data in the 24 hours ending 10/20/20 1330 There were no vitals filed for this visit.  Examination:  General exam: Icteric, Cachetic Respiratory system; CTA Cardiovascular system: S 1, S 2 RRR Gastrointestinal system: BS present, soft, nt Central nervous system: Alert Extremities: Symmetric power  Data Reviewed: I have personally reviewed following labs and imaging studies  CBC: Recent Labs  Lab 10/18/20 2307 10/19/20 0832  WBC 6.8 7.4  NEUTROABS 5.1  --   HGB 9.4* 9.7*  HCT 26.9* 27.1*  MCV 93.4 93.1  PLT 281 419   Basic Metabolic Panel: Recent Labs  Lab 10/18/20 2307 10/19/20 0832  NA 135 137  K 4.2 4.1  CL 105 108  CO2 20* 19*  GLUCOSE 108* 99  BUN 64* 65*  CREATININE  1.92* 1.81*  CALCIUM 9.1 9.0   GFR: CrCl cannot be calculated (Unknown ideal weight.). Liver Function Tests: Recent Labs  Lab 10/18/20 2307 10/19/20 0832  AST 173* 162*  ALT 101* 97*  ALKPHOS 366* 363*  BILITOT 23.1* 22.5*  PROT 7.5 7.6  ALBUMIN 1.8* 1.7*   Recent Labs  Lab 10/18/20 2307  LIPASE 24   Recent Labs  Lab 10/19/20 0832  AMMONIA 36*   Coagulation Profile: Recent Labs  Lab 10/18/20 2307  INR 1.4*   Cardiac Enzymes: No results for input(s): CKTOTAL, CKMB, CKMBINDEX, TROPONINI in the last 168 hours. BNP (last 3 results) No results for input(s): PROBNP in the last 8760 hours. HbA1C: No results for  input(s): HGBA1C in the last 72 hours. CBG: No results for input(s): GLUCAP in the last 168 hours. Lipid Profile: No results for input(s): CHOL, HDL, LDLCALC, TRIG, CHOLHDL, LDLDIRECT in the last 72 hours. Thyroid Function Tests: No results for input(s): TSH, T4TOTAL, FREET4, T3FREE, THYROIDAB in the last 72 hours. Anemia Panel: No results for input(s): VITAMINB12, FOLATE, FERRITIN, TIBC, IRON, RETICCTPCT in the last 72 hours. Sepsis Labs: No results for input(s): PROCALCITON, LATICACIDVEN in the last 168 hours.  Recent Results (from the past 240 hour(s))  Resp Panel by RT-PCR (Flu A&B, Covid) Nasopharyngeal Swab     Status: None   Collection Time: 10/19/20  6:21 AM   Specimen: Nasopharyngeal Swab; Nasopharyngeal(NP) swabs in vial transport medium  Result Value Ref Range Status   SARS Coronavirus 2 by RT PCR NEGATIVE NEGATIVE Final    Comment: (NOTE) SARS-CoV-2 target nucleic acids are NOT DETECTED.  The SARS-CoV-2 RNA is generally detectable in upper respiratory specimens during the acute phase of infection. The lowest concentration of SARS-CoV-2 viral copies this assay can detect is 138 copies/mL. A negative result does not preclude SARS-Cov-2 infection and should not be used as the sole basis for treatment or other patient management decisions. A negative result may occur with  improper specimen collection/handling, submission of specimen other than nasopharyngeal swab, presence of viral mutation(s) within the areas targeted by this assay, and inadequate number of viral copies(<138 copies/mL). A negative result must be combined with clinical observations, patient history, and epidemiological information. The expected result is Negative.  Fact Sheet for Patients:  EntrepreneurPulse.com.au  Fact Sheet for Healthcare Providers:  IncredibleEmployment.be  This test is no t yet approved or cleared by the [place] FDA and  has been  authorized for detection and/or diagnosis of SARS-CoV-2 by FDA under an Emergency Use Authorization (EUA). This EUA will remain  in effect (meaning this test can be used) for the duration of the COVID-19 declaration under Section 564(b)(1) of the Act, 21 U.S.C.section 360bbb-3(b)(1), unless the authorization is terminated  or revoked sooner.       Influenza A by PCR NEGATIVE NEGATIVE Final   Influenza B by PCR NEGATIVE NEGATIVE Final    Comment: (NOTE) The Xpert Xpress SARS-CoV-2/FLU/RSV plus assay is intended as an aid in the diagnosis of influenza from Nasopharyngeal swab specimens and should not be used as a sole basis for treatment. Nasal washings and aspirates are unacceptable for Xpert Xpress SARS-CoV-2/FLU/RSV testing.  Fact Sheet for Patients: EntrepreneurPulse.com.au  Fact Sheet for Healthcare Providers: IncredibleEmployment.be  This test is not yet approved or cleared by the [place] FDA and has been authorized for detection and/or diagnosis of SARS-CoV-2 by FDA under an Emergency Use Authorization (EUA). This EUA will remain in effect (meaning this test can be used) for  the duration of the COVID-19 declaration under Section 564(b)(1) of the Act, 21 U.S.C. section 360bbb-3(b)(1), unless the authorization is terminated or revoked.  Performed at [facility], [place] [address], [place], [place] [address]          Radiology Studies: US Abdomen Limited RUQ (LIVER/GB)  Addendum Date: 10/19/2020   ADDENDUM REPORT: 10/19/2020 14:33 ADDENDUM: The hospitalist called and we discussed the case. By report, the patient presents with elevated bilirubin and a known diagnosis of gallbladder cancer seen on outside min imaging. With this history in mind, the dense shadowing in the region of the gallbladder neck could represent a stone or calcification within the known malignancy. Material adjacent to the shadowing likely represents the  patient's known gallbladder cancer. By report, there was liver invasion which is not appreciated on this study. Electronically Signed   By: Dorise Bullion III M.D   On: 10/19/2020 14:33   Result Date: 10/19/2020 CLINICAL DATA:  Abnormal liver enzymes. EXAM: ULTRASOUND ABDOMEN LIMITED RIGHT UPPER QUADRANT COMPARISON:  None. FINDINGS: Gallbladder: Cholelithiasis is identified. No wall thickening, pericholecystic fluid, or Murphy's sign. The patient's reported gallbladder cancer is not clearly visualized on today's imaging. Common bile duct: Diameter: 2.3 mm Liver: Diffusely heterogeneous with no focal mass. Portal vein is patent on color Doppler imaging with normal direction of blood flow towards the liver. Other: None. IMPRESSION: 1. Cholelithiasis. No wall thickening, pericholecystic fluid, or Murphy's sign. The patient's reported gallbladder cancer is not visualized on today's imaging. 2. The echotexture of the liver is diffusely heterogeneous without focal mass, nonspecific. Electronically Signed: By: Dorise Bullion III M.D On: 10/19/2020 12:00        Scheduled Meds: . antiseptic oral rinse  15 mL Topical BID  . isosorbide mononitrate  30 mg Oral Daily   Continuous Infusions:    LOS: 1 day    Time spent: 35 minutes    Mikiyah Glasner A Laurynn Mccorvey, MD [facility]   If 7PM-7AM, please contact night-coverage [email or web address]  10/20/2020, 1:30 PM

## 2020-10-20 NOTE — Progress Notes (Signed)
Family at bedside throughout day. Assisting with bathing, turning and positioning, and feeding. Patient is eating small bites of applesauce and small sips of juice this evening. Resting comfortably throughout shift, no PRN medications used thus far. Frequent rounding done.

## 2020-10-20 NOTE — Progress Notes (Signed)
Family requesting patient be bathed daily. Veterinary surgeon. Written on patient door as well.

## 2020-10-21 DIAGNOSIS — C23 Malignant neoplasm of gallbladder: Secondary | ICD-10-CM

## 2020-10-21 MED ORDER — SENNOSIDES-DOCUSATE SODIUM 8.6-50 MG PO TABS: 1.0000 | ORAL_TABLET | Freq: Every evening | ORAL | 0 refills | Status: DC | PRN

## 2020-10-21 MED ORDER — HALOPERIDOL 2 MG PO TABS: 2.0000 mg | ORAL_TABLET | Freq: Four times a day (QID) | ORAL | 0 refills | Status: DC | PRN

## 2020-10-21 MED ORDER — TRAMADOL HCL 50 MG PO TABS
50.0000 mg | ORAL_TABLET | Freq: Two times a day (BID) | ORAL | Status: DC
  Administered 2020-10-21 – 2020-10-22 (×3): 50 mg via ORAL
  Filled 2020-10-21 (×2): qty 1

## 2020-10-21 MED ORDER — TRAMADOL HCL 50 MG PO TABS: 50.0000 mg | ORAL_TABLET | Freq: Two times a day (BID) | ORAL | 0 refills | Status: DC

## 2020-10-21 MED ORDER — GLYCOPYRROLATE 1 MG PO TABS: 1.0000 mg | ORAL_TABLET | ORAL | 0 refills | Status: DC | PRN

## 2020-10-21 NOTE — Progress Notes (Signed)
Daily Progress Note   Patient Name: Jasmine Lin       Date: 10/21/2020 DOB: Feb 13, 1934  Age: 87 y.o. MRN#: [id number] Attending Physician: Elmarie Shiley, MD Primary Care Physician: Wilber Bihari, [place] Admit Date: 10/18/2020  Reason for Consultation/Follow-up: end of life care, symptoms check  Subjective: Patient appears comfortable. She opens her eyes intermittently and attempts to verbally respond - speech is unintelligible. No non-verbal signs of pain or discomfort noted. Respirations are even and unlabored. No excessive respiratory secretions noted.   Family present at bedside; including a great-niece and 2 grandchildren. Great-niece reports patient took a few sips of ginger ale earlier this morning. Discussed that [facility] at [facility] available today. Education and counseling provided on expectations at EOL. Emotional support provided.     Length of Stay: 2  Current Medications: Scheduled Meds:  . antiseptic oral rinse  15 mL Topical BID  . isosorbide mononitrate  30 mg Oral Daily  . traMADol  50 mg Oral Q12H      PRN Meds: fentaNYL (SUBLIMAZE) injection, glycopyrrolate **OR** glycopyrrolate **OR** glycopyrrolate, haloperidol **OR** haloperidol **OR** haloperidol lactate, LORazepam **OR** LORazepam **OR** LORazepam, ondansetron **OR** ondansetron (ZOFRAN) IV, polyvinyl alcohol, senna-docusate        Vital Signs: BP (!) 116/56 (BP Location: Right Arm)   Pulse 66   Temp 97.9 F (36.6 C) (Axillary)   Resp 16   SpO2 100%  SpO2: SpO2: 100 % O2 Device: O2 Device: Room Air O2 Flow Rate:    Intake/output summary:   Intake/Output Summary (Last 24 hours) at 10/21/2020 1149 Last data filed at 10/21/2020 1048 Gross per 24 hour  Intake 80 ml  Output 950 ml  Net  -870 ml        Palliative Assessment/Data: PPS 10-20%      Palliative Care Assessment & Plan   Patient Profile: 87 y.o.femalewith past medical history of hypertension,large B-cell lymphoma with metastasis as well as gallbladder malignancypresented to the ED on 10/18/20 from home with family concerns of no PO intake and generalized weakness x3 days. Patient wasadmitted on3/11/2022with transaminases/hyperbilirubinemia in setting of known gallbladder cancer, AKI, and failure to thrive.  Assessment: AKI Jaundice Generalized weakness Failure to thrive in adult Gallbladder cancer  Recommendations/Plan:  Continue full comfort measures  DNR/DNI as previously documented - gold form  completed and placed on shadow chart  Pending transfer to [facility] to provide frequent assessments and administer PRN medications as clinically necessary to ensure EOL comfort  PMT will continue to follow  Goals of Care and Additional Recommendations:  Limitations on Scope of Treatment: Full Comfort Care  Code Status: DNR/DNI  Prognosis:   < 2 weeks  Discharge Planning:  Hospice facility  Care plan was discussed with attending MD, LCSW, primary RN, and hospice liaison  Thank you for allowing the Palliative Medicine Team to assist in the care of this patient.   Total Time 15 minutes Prolonged Time Billed  no       Greater than 50%  of this time was spent counseling and coordinating care related to the above assessment and plan.  Lavena Bullion, NP  Please contact Palliative Medicine Team phone at [phone] for questions and concerns.

## 2020-10-21 NOTE — Progress Notes (Addendum)
Manufacturing engineer [facility]) Hospital Liaison note.    Addendum:  1:46pm: after multiple attempts to get consents completed today pt's son Kyung Rudd asks to wait until tomorrow to complete consents.  Care team updated via secure chat.  [facility] will hold the bed until tomorrow.  Chart reviewed and eligibility confirmed. Spoke with family to confirm interest and explain services. Family agreeable to transfer today. TOC aware.    [facility] will notify TOC when registration paperwork has been completed to arrange transport.   RN please call report to [phone].  Please be sure the [facility] DNR form transports with the patient.  Thank you for the opportunity to participate in this patient's care.  Domenic Moras, BSN, RN [facility] Liaison (listed on [place] under Hospice/[facility])    [phone] (24h on call)

## 2020-10-21 NOTE — TOC Initial Note (Addendum)
Transition of Care [facility]) - Initial/Assessment Note    Patient Details  Name: Jasmine Lin MRN: [id number] Date of Birth: [birth year]  [facility] CM/SW Contact:    Marilu Favre, RN Phone Number: 10/21/2020, 10:28 AM  Clinical Narrative:                  Chrislynn with [facility] can offer bed.Son Kyung Rudd at bedside confirmed he does want [employer]. Left message for Chrislynn.  [facility] offered a bed son accepted. Once son signs consents she can be transferred. [facility] spoke to son , he told Dorothey Baseman he will call them back to sign consents.   Called [facility] patient is on will call. Once consents are signed and report called. Will need to call [facility]  Expected Discharge Plan: [place] Barriers to Discharge: No Barriers Identified   Patient Goals and CMS Choice     Choice offered to / list presented to : Adult Children  Expected Discharge Plan and Services Expected Discharge Plan: [place]     Post Acute Care Choice: Hospice   Expected Discharge Date: 10/21/20                                    Prior Living Arrangements/Services                       Activities of Daily Living      Permission Sought/Granted                  Emotional Assessment              Admission diagnosis:  Dehydration [E86.0] Weakness [R53.1] AKI (acute kidney injury) ([place]) [N17.9] Abnormal transaminases [R74.8] Patient Active Problem List   Diagnosis Date Noted  . AKI (acute kidney injury) ([place]) 10/19/2020  . Jaundice 10/19/2020  . Generalized weakness 10/19/2020  . Failure to thrive in adult 10/19/2020   PCP:  Wilber Bihari, PA Pharmacy:   [id number] - [place], [place] [address] [place] [address] Phone: [phone] Fax: [phone]     Social Determinants of Health (SDOH) Interventions    Readmission Risk  Interventions No flowsheet data found.

## 2020-10-21 NOTE — Discharge Summary (Signed)
Physician Discharge Summary  Jasmine Lin MOQ:[id number] DOB: July 23, 1934 DOA: 10/18/2020  PCP: Wilber Bihari, PA  Admit date: 10/18/2020 Discharge date: 10/21/2020  Admitted From: Home Disposition:  Residential Hospice  Recommendations for Outpatient Follow-up:  Comfort care.    Discharge Condition: Guarded CODE STATUS: DNR Diet recommendation: Comfort feeding.   Brief/Interim Summary: 87 year old with past medical history significant for large B-cell lymphoma with metastasis as well as gallbladder malignancy presented via EMS due to family concern for weakness.  Patient is not able to provide history.  Per family patient has not been eating or drinking over the last 4 days.  EMS evaluation showed patient had low blood pressure and received IV fluids.  Patient has not received any intervention or treatment for her malignancy recently.  Evaluation in the ED patient was found to have hyperbilirubinemia, transaminases, AKI.  1-AKI: Secondary to poor oral intake, hypotension. Treated with IV fluids initially.  Family has decided to transition to full comfort care. Comfort care. Plan for residential  Continue to have poor oral intake.   2-Transaminases, hyperbilirubinemia in the setting of known gallbladder cancer; Diagnosed by Biopsy and Pet scan at [facility].  -Ultrasound no dilation of the common bile duct dilation. Cholelithiasis, no obstruction. [place] is not best test to identified gallbladder malignancy.  -Palliative met with family, plan is for full comfort care.  -Plan for residential hospice facility.  -Schedule tramadol BID. Patient was taking it on regular basis at home.  -Awaiting residential hospice facility   3-FTT, generalized weakness.  Secondary to malignancy.    Discharge Diagnoses:  Principal Problem:   AKI (acute kidney injury) ([place]) Active Problems:   Jaundice   Generalized weakness   Failure to thrive in adult    Discharge Instructions  Discharge  Instructions    Diet - low sodium heart healthy   Complete by: As directed    Increase activity slowly   Complete by: As directed      Allergies as of 10/21/2020      Reactions   Codeine Hives, Itching, Swelling   Hydrocodone-acetaminophen Hives   She she developed urticaria over her forehead and chest just a few min after receiving 1 dose of Norco 5/325.   She she developed urticaria over her forehead and chest just a few min after receiving 1 dose of Norco 5/325.   She she developed urticaria over her forehead and chest just a few min after receiving 1 dose of Norco 5/325.     Diclofenac Other (See Comments)   Dizziness   Hydrochlorothiazide Other (See Comments)   Dizziness      Medication List    STOP taking these medications   aspirin 81 MG chewable tablet   lisinopril 20 MG tablet Commonly known as: ZESTRIL   meclizine 12.5 MG tablet Commonly known as: ANTIVERT   pravastatin 40 MG tablet Commonly known as: PRAVACHOL     TAKE these medications   glycopyrrolate 1 MG tablet Commonly known as: ROBINUL Take 1 tablet (1 mg total) by mouth every 4 (four) hours as needed (excessive secretions).   haloperidol 2 MG tablet Commonly known as: HALDOL Take 1 tablet (2 mg total) by mouth every 6 (six) hours as needed for agitation (or delirium).   isosorbide mononitrate 30 MG 24 hr tablet Commonly known as: IMDUR Take 30 mg by mouth daily.   senna-docusate 8.6-50 MG tablet Commonly known as: Senokot-S Take 1 tablet by mouth at bedtime as needed for mild constipation.   traMADol  50 MG tablet Commonly known as: ULTRAM Take 1 tablet (50 mg total) by mouth every 12 (twelve) hours. What changed:   when to take this  reasons to take this       Allergies  Allergen Reactions  . Codeine Hives, Itching and Swelling  . Hydrocodone-Acetaminophen Hives    She she developed urticaria over her forehead and chest just a few min after receiving 1 dose of Norco 5/325.   She  she developed urticaria over her forehead and chest just a few min after receiving 1 dose of Norco 5/325.   She she developed urticaria over her forehead and chest just a few min after receiving 1 dose of Norco 5/325.     Marland Kitchen Diclofenac Other (See Comments)    Dizziness  . Hydrochlorothiazide Other (See Comments)    Dizziness    Consultations:  Palliative   Procedures/Studies: US Abdomen Limited RUQ (LIVER/GB)  Addendum Date: 10/19/2020   ADDENDUM REPORT: 10/19/2020 14:33 ADDENDUM: The hospitalist called and we discussed the case. By report, the patient presents with elevated bilirubin and a known diagnosis of gallbladder cancer seen on outside min imaging. With this history in mind, the dense shadowing in the region of the gallbladder neck could represent a stone or calcification within the known malignancy. Material adjacent to the shadowing likely represents the patient's known gallbladder cancer. By report, there was liver invasion which is not appreciated on this study. Electronically Signed   By: Dorise Bullion III M.D   On: 10/19/2020 14:33   Result Date: 10/19/2020 CLINICAL DATA:  Abnormal liver enzymes. EXAM: ULTRASOUND ABDOMEN LIMITED RIGHT UPPER QUADRANT COMPARISON:  None. FINDINGS: Gallbladder: Cholelithiasis is identified. No wall thickening, pericholecystic fluid, or Murphy's sign. The patient's reported gallbladder cancer is not clearly visualized on today's imaging. Common bile duct: Diameter: 2.3 mm Liver: Diffusely heterogeneous with no focal mass. Portal vein is patent on color Doppler imaging with normal direction of blood flow towards the liver. Other: None. IMPRESSION: 1. Cholelithiasis. No wall thickening, pericholecystic fluid, or Murphy's sign. The patient's reported gallbladder cancer is not visualized on today's imaging. 2. The echotexture of the liver is diffusely heterogeneous without focal mass, nonspecific. Electronically Signed: By: Dorise Bullion III M.D On:  10/19/2020 12:00     Subjective: Poor oral intake.  Son at bedside, all questions answer. Patient was taking tramadol at home schedule TID> will order it.    Discharge Exam: Vitals:   10/20/20 0127 10/21/20 0543  BP: 120/77 (!) 116/56  Pulse: 65 66  Resp: 15 16  Temp: 98.5 F (36.9 C) 97.9 F (36.6 C)  SpO2: 97% 100%     General: Icteric, cachetic, lethargic Cardiovascular: RRR, S1/S2 +, no rubs, no gallops Respiratory: CTA bilaterally, no wheezing, no rhonchi Abdominal: Soft, NT, ND, bowel sounds + Extremities: no edema, no cyanosis    The results of significant diagnostics from this hospitalization (including imaging, microbiology, ancillary and laboratory) are listed below for reference.     Microbiology: Recent Results (from the past 240 hour(s))  Resp Panel by RT-PCR (Flu A&B, Covid) Nasopharyngeal Swab     Status: None   Collection Time: 10/19/20  6:21 AM   Specimen: Nasopharyngeal Swab; Nasopharyngeal(NP) swabs in vial transport medium  Result Value Ref Range Status   SARS Coronavirus 2 by RT PCR NEGATIVE NEGATIVE Final    Comment: (NOTE) SARS-CoV-2 target nucleic acids are NOT DETECTED.  The SARS-CoV-2 RNA is generally detectable in upper respiratory specimens during the acute phase  of infection. The lowest concentration of SARS-CoV-2 viral copies this assay can detect is 138 copies/mL. A negative result does not preclude SARS-Cov-2 infection and should not be used as the sole basis for treatment or other patient management decisions. A negative result may occur with  improper specimen collection/handling, submission of specimen other than nasopharyngeal swab, presence of viral mutation(s) within the areas targeted by this assay, and inadequate number of viral copies(<138 copies/mL). A negative result must be combined with clinical observations, patient history, and epidemiological information. The expected result is Negative.  Fact Sheet for Patients:   EntrepreneurPulse.com.au  Fact Sheet for Healthcare Providers:  IncredibleEmployment.be  This test is no t yet approved or cleared by the [place] FDA and  has been authorized for detection and/or diagnosis of SARS-CoV-2 by FDA under an Emergency Use Authorization (EUA). This EUA will remain  in effect (meaning this test can be used) for the duration of the COVID-19 declaration under Section 564(b)(1) of the Act, 21 U.S.C.section 360bbb-3(b)(1), unless the authorization is terminated  or revoked sooner.       Influenza A by PCR NEGATIVE NEGATIVE Final   Influenza B by PCR NEGATIVE NEGATIVE Final    Comment: (NOTE) The Xpert Xpress SARS-CoV-2/FLU/RSV plus assay is intended as an aid in the diagnosis of influenza from Nasopharyngeal swab specimens and should not be used as a sole basis for treatment. Nasal washings and aspirates are unacceptable for Xpert Xpress SARS-CoV-2/FLU/RSV testing.  Fact Sheet for Patients: EntrepreneurPulse.com.au  Fact Sheet for Healthcare Providers: IncredibleEmployment.be  This test is not yet approved or cleared by the [place] FDA and has been authorized for detection and/or diagnosis of SARS-CoV-2 by FDA under an Emergency Use Authorization (EUA). This EUA will remain in effect (meaning this test can be used) for the duration of the COVID-19 declaration under Section 564(b)(1) of the Act, 21 U.S.C. section 360bbb-3(b)(1), unless the authorization is terminated or revoked.  Performed at [facility], [place] [address], [place], [place] [address]      Labs: BNP (last 3 results) No results for input(s): BNP in the last 8760 hours. Basic Metabolic Panel: Recent Labs  Lab 10/18/20 2307 10/19/20 0832  NA 135 137  K 4.2 4.1  CL 105 108  CO2 20* 19*  GLUCOSE 108* 99  BUN 64* 65*  CREATININE 1.92* 1.81*  CALCIUM 9.1 9.0   Liver Function Tests: Recent  Labs  Lab 10/18/20 2307 10/19/20 0832  AST 173* 162*  ALT 101* 97*  ALKPHOS 366* 363*  BILITOT 23.1* 22.5*  PROT 7.5 7.6  ALBUMIN 1.8* 1.7*   Recent Labs  Lab 10/18/20 2307  LIPASE 24   Recent Labs  Lab 10/19/20 0832  AMMONIA 36*   CBC: Recent Labs  Lab 10/18/20 2307 10/19/20 0832  WBC 6.8 7.4  NEUTROABS 5.1  --   HGB 9.4* 9.7*  HCT 26.9* 27.1*  MCV 93.4 93.1  PLT 281 277   Cardiac Enzymes: No results for input(s): CKTOTAL, CKMB, CKMBINDEX, TROPONINI in the last 168 hours. BNP: Invalid input(s): POCBNP CBG: No results for input(s): GLUCAP in the last 168 hours. D-Dimer No results for input(s): DDIMER in the last 72 hours. Hgb A1c No results for input(s): HGBA1C in the last 72 hours. Lipid Profile No results for input(s): CHOL, HDL, LDLCALC, TRIG, CHOLHDL, LDLDIRECT in the last 72 hours. Thyroid function studies No results for input(s): TSH, T4TOTAL, T3FREE, THYROIDAB in the last 72 hours.  Invalid input(s): FREET3 Anemia work  up No results for input(s): VITAMINB12, FOLATE, FERRITIN, TIBC, IRON, RETICCTPCT in the last 72 hours. Urinalysis    Component Value Date/Time   LABSPEC 1.025 04/18/2019 1522   PHURINE 7.0 04/18/2019 1522   GLUCOSEU NEGATIVE 04/18/2019 1522   HGBUR TRACE (A) 04/18/2019 1522   BILIRUBINUR NEGATIVE 04/18/2019 1522   KETONESUR NEGATIVE 04/18/2019 1522   PROTEINUR NEGATIVE 04/18/2019 1522   UROBILINOGEN 0.2 04/18/2019 1522   NITRITE NEGATIVE 04/18/2019 1522   LEUKOCYTESUR NEGATIVE 04/18/2019 1522   Sepsis Labs Invalid input(s): PROCALCITONIN,  WBC,  LACTICIDVEN Microbiology Recent Results (from the past 240 hour(s))  Resp Panel by RT-PCR (Flu A&B, Covid) Nasopharyngeal Swab     Status: None   Collection Time: 10/19/20  6:21 AM   Specimen: Nasopharyngeal Swab; Nasopharyngeal(NP) swabs in vial transport medium  Result Value Ref Range Status   SARS Coronavirus 2 by RT PCR NEGATIVE NEGATIVE Final    Comment: (NOTE) SARS-CoV-2  target nucleic acids are NOT DETECTED.  The SARS-CoV-2 RNA is generally detectable in upper respiratory specimens during the acute phase of infection. The lowest concentration of SARS-CoV-2 viral copies this assay can detect is 138 copies/mL. A negative result does not preclude SARS-Cov-2 infection and should not be used as the sole basis for treatment or other patient management decisions. A negative result may occur with  improper specimen collection/handling, submission of specimen other than nasopharyngeal swab, presence of viral mutation(s) within the areas targeted by this assay, and inadequate number of viral copies(<138 copies/mL). A negative result must be combined with clinical observations, patient history, and epidemiological information. The expected result is Negative.  Fact Sheet for Patients:  EntrepreneurPulse.com.au  Fact Sheet for Healthcare Providers:  IncredibleEmployment.be  This test is no t yet approved or cleared by the [place] FDA and  has been authorized for detection and/or diagnosis of SARS-CoV-2 by FDA under an Emergency Use Authorization (EUA). This EUA will remain  in effect (meaning this test can be used) for the duration of the COVID-19 declaration under Section 564(b)(1) of the Act, 21 U.S.C.section 360bbb-3(b)(1), unless the authorization is terminated  or revoked sooner.       Influenza A by PCR NEGATIVE NEGATIVE Final   Influenza B by PCR NEGATIVE NEGATIVE Final    Comment: (NOTE) The Xpert Xpress SARS-CoV-2/FLU/RSV plus assay is intended as an aid in the diagnosis of influenza from Nasopharyngeal swab specimens and should not be used as a sole basis for treatment. Nasal washings and aspirates are unacceptable for Xpert Xpress SARS-CoV-2/FLU/RSV testing.  Fact Sheet for Patients: EntrepreneurPulse.com.au  Fact Sheet for Healthcare  Providers: IncredibleEmployment.be  This test is not yet approved or cleared by the [place] FDA and has been authorized for detection and/or diagnosis of SARS-CoV-2 by FDA under an Emergency Use Authorization (EUA). This EUA will remain in effect (meaning this test can be used) for the duration of the COVID-19 declaration under Section 564(b)(1) of the Act, 21 U.S.C. section 360bbb-3(b)(1), unless the authorization is terminated or revoked.  Performed at [facility], [place] [address], [place], [place] [address]      Time coordinating discharge: 40 minutes  SIGNED:   Elmarie Shiley, MD  [facility]

## 2020-10-22 DIAGNOSIS — C23 Malignant neoplasm of gallbladder: Secondary | ICD-10-CM

## 2020-10-22 NOTE — Progress Notes (Signed)
RN called and spoke to Reeves at [facility] and gave report on the patient. PTAR being called family has been notified and pt has been bathed and cleaned for transport.

## 2020-10-22 NOTE — Care Management Important Message (Signed)
Important Message  Patient Details  Name: Jasmine Lin MRN: [id number] Date of Birth: December 04, 1933   Medicare Important Message Given:  Yes     Eero Dini P Damonte Frieson 10/22/2020, 11:43 AM

## 2020-10-22 NOTE — Care Management Important Message (Signed)
Important Message  Patient Details  Name: Jasmine Lin MRN: [id number] Date of Birth: 1934/03/21   Medicare Important Message Given:  Yes     Guillermo Difrancesco [address] 10/22/2020, 4:18 PM

## 2020-10-22 NOTE — Discharge Summary (Signed)
Physician Discharge Summary  Jasmine Lin QQI:[id number] DOB: 1934/08/07 DOA: 10/18/2020  PCP: Wilber Bihari, PA  Admit date: 10/18/2020 Discharge date: 10/22/2020  Admitted From: Home Disposition:  Residential Hospice  Recommendations for Outpatient Follow-up:  Comfort care.    Discharge Condition: Guarded CODE STATUS: DNR Diet recommendation: Comfort feeding.   Brief/Interim Summary: 87 year old with past medical history significant for large B-cell lymphoma with metastasis as well as gallbladder malignancy presented via EMS due to family concern for weakness.  Patient is not able to provide history.  Per family patient has not been eating or drinking over the last 4 days.  EMS evaluation showed patient had low blood pressure and received IV fluids.  Patient has not received any intervention or treatment for her malignancy recently.  Evaluation in the ED patient was found to have hyperbilirubinemia, transaminases, AKI.  1-AKI: Secondary to poor oral intake, hypotension. Treated with IV fluids initially.  Family has decided to transition to full comfort care. Comfort care. Plan for residential  Continue to have poor oral intake.   2-Transaminases, hyperbilirubinemia in the setting of known gallbladder cancer; Diagnosed by Biopsy and Pet scan at [facility].  -Ultrasound no dilation of the common bile duct dilation. Cholelithiasis, no obstruction. [place] is not best test to identified gallbladder malignancy.  -Palliative met with family, plan is for full comfort care.  -Plan for residential hospice facility.  -Schedule tramadol BID. Patient was taking it on regular basis at home.  -Awaiting residential hospice facility   3-FTT, generalized weakness.  Secondary to malignancy.   Patient stable to be transfer to residential hospice facility.   Discharge Diagnoses:  Principal Problem:   AKI (acute kidney injury) ([place]) Active Problems:   Jaundice   Generalized weakness    Failure to thrive in adult    Discharge Instructions  Discharge Instructions    Diet - low sodium heart healthy   Complete by: As directed    Increase activity slowly   Complete by: As directed      Allergies as of 10/22/2020      Reactions   Codeine Hives, Itching, Swelling   Hydrocodone-acetaminophen Hives   She she developed urticaria over her forehead and chest just a few min after receiving 1 dose of Norco 5/325.   She she developed urticaria over her forehead and chest just a few min after receiving 1 dose of Norco 5/325.   She she developed urticaria over her forehead and chest just a few min after receiving 1 dose of Norco 5/325.     Diclofenac Other (See Comments)   Dizziness   Hydrochlorothiazide Other (See Comments)   Dizziness      Medication List    STOP taking these medications   aspirin 81 MG chewable tablet   lisinopril 20 MG tablet Commonly known as: ZESTRIL   meclizine 12.5 MG tablet Commonly known as: ANTIVERT   pravastatin 40 MG tablet Commonly known as: PRAVACHOL     TAKE these medications   glycopyrrolate 1 MG tablet Commonly known as: ROBINUL Take 1 tablet (1 mg total) by mouth every 4 (four) hours as needed (excessive secretions).   haloperidol 2 MG tablet Commonly known as: HALDOL Take 1 tablet (2 mg total) by mouth every 6 (six) hours as needed for agitation (or delirium).   isosorbide mononitrate 30 MG 24 hr tablet Commonly known as: IMDUR Take 30 mg by mouth daily.   senna-docusate 8.6-50 MG tablet Commonly known as: Senokot-S Take 1 tablet by mouth  at bedtime as needed for mild constipation.   traMADol 50 MG tablet Commonly known as: ULTRAM Take 1 tablet (50 mg total) by mouth every 12 (twelve) hours. What changed:   when to take this  reasons to take this       Allergies  Allergen Reactions  . Codeine Hives, Itching and Swelling  . Hydrocodone-Acetaminophen Hives    She she developed urticaria over her forehead and  chest just a few min after receiving 1 dose of Norco 5/325.   She she developed urticaria over her forehead and chest just a few min after receiving 1 dose of Norco 5/325.   She she developed urticaria over her forehead and chest just a few min after receiving 1 dose of Norco 5/325.     Marland Kitchen Diclofenac Other (See Comments)    Dizziness  . Hydrochlorothiazide Other (See Comments)    Dizziness    Consultations:  Palliative   Procedures/Studies: US Abdomen Limited RUQ (LIVER/GB)  Addendum Date: 10/19/2020   ADDENDUM REPORT: 10/19/2020 14:33 ADDENDUM: The hospitalist called and we discussed the case. By report, the patient presents with elevated bilirubin and a known diagnosis of gallbladder cancer seen on outside min imaging. With this history in mind, the dense shadowing in the region of the gallbladder neck could represent a stone or calcification within the known malignancy. Material adjacent to the shadowing likely represents the patient's known gallbladder cancer. By report, there was liver invasion which is not appreciated on this study. Electronically Signed   By: Dorise Bullion III M.D   On: 10/19/2020 14:33   Result Date: 10/19/2020 CLINICAL DATA:  Abnormal liver enzymes. EXAM: ULTRASOUND ABDOMEN LIMITED RIGHT UPPER QUADRANT COMPARISON:  None. FINDINGS: Gallbladder: Cholelithiasis is identified. No wall thickening, pericholecystic fluid, or Murphy's sign. The patient's reported gallbladder cancer is not clearly visualized on today's imaging. Common bile duct: Diameter: 2.3 mm Liver: Diffusely heterogeneous with no focal mass. Portal vein is patent on color Doppler imaging with normal direction of blood flow towards the liver. Other: None. IMPRESSION: 1. Cholelithiasis. No wall thickening, pericholecystic fluid, or Murphy's sign. The patient's reported gallbladder cancer is not visualized on today's imaging. 2. The echotexture of the liver is diffusely heterogeneous without focal mass,  nonspecific. Electronically Signed: By: Dorise Bullion III M.D On: 10/19/2020 12:00    Subjective: Poor oral intake, sleepy, appears calm  Discharge Exam: Vitals:   10/21/20 0543 10/22/20 0538  BP: (!) 116/56 (!) 98/42  Pulse: 66 64  Resp: 16 14  Temp: 97.9 F (36.6 C) 97.7 F (36.5 C)  SpO2: 100% 100%     General: Icteric, cachetic, lethargic Cardiovascular: RRR, S1/S2 +, no rubs, no gallops Respiratory: CTA bilaterally, no wheezing, no rhonchi Abdominal: Soft, NT, ND, bowel sounds + Extremities: no edema, no cyanosis    The results of significant diagnostics from this hospitalization (including imaging, microbiology, ancillary and laboratory) are listed below for reference.     Microbiology: Recent Results (from the past 240 hour(s))  Resp Panel by RT-PCR (Flu A&B, Covid) Nasopharyngeal Swab     Status: None   Collection Time: 10/19/20  6:21 AM   Specimen: Nasopharyngeal Swab; Nasopharyngeal(NP) swabs in vial transport medium  Result Value Ref Range Status   SARS Coronavirus 2 by RT PCR NEGATIVE NEGATIVE Final    Comment: (NOTE) SARS-CoV-2 target nucleic acids are NOT DETECTED.  The SARS-CoV-2 RNA is generally detectable in upper respiratory specimens during the acute phase of infection. The lowest concentration of SARS-CoV-2  viral copies this assay can detect is 138 copies/mL. A negative result does not preclude SARS-Cov-2 infection and should not be used as the sole basis for treatment or other patient management decisions. A negative result may occur with  improper specimen collection/handling, submission of specimen other than nasopharyngeal swab, presence of viral mutation(s) within the areas targeted by this assay, and inadequate number of viral copies(<138 copies/mL). A negative result must be combined with clinical observations, patient history, and epidemiological information. The expected result is Negative.  Fact Sheet for Patients:   EntrepreneurPulse.com.au  Fact Sheet for Healthcare Providers:  IncredibleEmployment.be  This test is no t yet approved or cleared by the [place] FDA and  has been authorized for detection and/or diagnosis of SARS-CoV-2 by FDA under an Emergency Use Authorization (EUA). This EUA will remain  in effect (meaning this test can be used) for the duration of the COVID-19 declaration under Section 564(b)(1) of the Act, 21 U.S.C.section 360bbb-3(b)(1), unless the authorization is terminated  or revoked sooner.       Influenza A by PCR NEGATIVE NEGATIVE Final   Influenza B by PCR NEGATIVE NEGATIVE Final    Comment: (NOTE) The Xpert Xpress SARS-CoV-2/FLU/RSV plus assay is intended as an aid in the diagnosis of influenza from Nasopharyngeal swab specimens and should not be used as a sole basis for treatment. Nasal washings and aspirates are unacceptable for Xpert Xpress SARS-CoV-2/FLU/RSV testing.  Fact Sheet for Patients: EntrepreneurPulse.com.au  Fact Sheet for Healthcare Providers: IncredibleEmployment.be  This test is not yet approved or cleared by the [place] FDA and has been authorized for detection and/or diagnosis of SARS-CoV-2 by FDA under an Emergency Use Authorization (EUA). This EUA will remain in effect (meaning this test can be used) for the duration of the COVID-19 declaration under Section 564(b)(1) of the Act, 21 U.S.C. section 360bbb-3(b)(1), unless the authorization is terminated or revoked.  Performed at [facility], [place] [address] [place], [place] [address]      Labs: BNP (last 3 results) No results for input(s): BNP in the last 8760 hours. Basic Metabolic Panel: Recent Labs  Lab 10/18/20 2307 10/19/20 0832  NA 135 137  K 4.2 4.1  CL 105 108  CO2 20* 19*  GLUCOSE 108* 99  BUN 64* 65*  CREATININE 1.92* 1.81*  CALCIUM 9.1 9.0   Liver Function Tests: Recent  Labs  Lab 10/18/20 2307 10/19/20 0832  AST 173* 162*  ALT 101* 97*  ALKPHOS 366* 363*  BILITOT 23.1* 22.5*  PROT 7.5 7.6  ALBUMIN 1.8* 1.7*   Recent Labs  Lab 10/18/20 2307  LIPASE 24   Recent Labs  Lab 10/19/20 0832  AMMONIA 36*   CBC: Recent Labs  Lab 10/18/20 2307 10/19/20 0832  WBC 6.8 7.4  NEUTROABS 5.1  --   HGB 9.4* 9.7*  HCT 26.9* 27.1*  MCV 93.4 93.1  PLT 281 277   Cardiac Enzymes: No results for input(s): CKTOTAL, CKMB, CKMBINDEX, TROPONINI in the last 168 hours. BNP: Invalid input(s): POCBNP CBG: No results for input(s): GLUCAP in the last 168 hours. D-Dimer No results for input(s): DDIMER in the last 72 hours. Hgb A1c No results for input(s): HGBA1C in the last 72 hours. Lipid Profile No results for input(s): CHOL, HDL, LDLCALC, TRIG, CHOLHDL, LDLDIRECT in the last 72 hours. Thyroid function studies No results for input(s): TSH, T4TOTAL, T3FREE, THYROIDAB in the last 72 hours.  Invalid input(s): FREET3 Anemia work up No results for input(s): VITAMINB12, FOLATE,  FERRITIN, TIBC, IRON, RETICCTPCT in the last 72 hours. Urinalysis    Component Value Date/Time   LABSPEC 1.025 04/18/2019 1522   PHURINE 7.0 04/18/2019 1522   GLUCOSEU NEGATIVE 04/18/2019 1522   HGBUR TRACE (A) 04/18/2019 1522   BILIRUBINUR NEGATIVE 04/18/2019 1522   KETONESUR NEGATIVE 04/18/2019 1522   PROTEINUR NEGATIVE 04/18/2019 1522   UROBILINOGEN 0.2 04/18/2019 1522   NITRITE NEGATIVE 04/18/2019 1522   LEUKOCYTESUR NEGATIVE 04/18/2019 1522   Sepsis Labs Invalid input(s): PROCALCITONIN,  WBC,  LACTICIDVEN Microbiology Recent Results (from the past 240 hour(s))  Resp Panel by RT-PCR (Flu A&B, Covid) Nasopharyngeal Swab     Status: None   Collection Time: 10/19/20  6:21 AM   Specimen: Nasopharyngeal Swab; Nasopharyngeal(NP) swabs in vial transport medium  Result Value Ref Range Status   SARS Coronavirus 2 by RT PCR NEGATIVE NEGATIVE Final    Comment: (NOTE) SARS-CoV-2  target nucleic acids are NOT DETECTED.  The SARS-CoV-2 RNA is generally detectable in upper respiratory specimens during the acute phase of infection. The lowest concentration of SARS-CoV-2 viral copies this assay can detect is 138 copies/mL. A negative result does not preclude SARS-Cov-2 infection and should not be used as the sole basis for treatment or other patient management decisions. A negative result may occur with  improper specimen collection/handling, submission of specimen other than nasopharyngeal swab, presence of viral mutation(s) within the areas targeted by this assay, and inadequate number of viral copies(<138 copies/mL). A negative result must be combined with clinical observations, patient history, and epidemiological information. The expected result is Negative.  Fact Sheet for Patients:  EntrepreneurPulse.com.au  Fact Sheet for Healthcare Providers:  IncredibleEmployment.be  This test is no t yet approved or cleared by the [place] FDA and  has been authorized for detection and/or diagnosis of SARS-CoV-2 by FDA under an Emergency Use Authorization (EUA). This EUA will remain  in effect (meaning this test can be used) for the duration of the COVID-19 declaration under Section 564(b)(1) of the Act, 21 U.S.C.section 360bbb-3(b)(1), unless the authorization is terminated  or revoked sooner.       Influenza A by PCR NEGATIVE NEGATIVE Final   Influenza B by PCR NEGATIVE NEGATIVE Final    Comment: (NOTE) The Xpert Xpress SARS-CoV-2/FLU/RSV plus assay is intended as an aid in the diagnosis of influenza from Nasopharyngeal swab specimens and should not be used as a sole basis for treatment. Nasal washings and aspirates are unacceptable for Xpert Xpress SARS-CoV-2/FLU/RSV testing.  Fact Sheet for Patients: EntrepreneurPulse.com.au  Fact Sheet for Healthcare  Providers: IncredibleEmployment.be  This test is not yet approved or cleared by the [place] FDA and has been authorized for detection and/or diagnosis of SARS-CoV-2 by FDA under an Emergency Use Authorization (EUA). This EUA will remain in effect (meaning this test can be used) for the duration of the COVID-19 declaration under Section 564(b)(1) of the Act, 21 U.S.C. section 360bbb-3(b)(1), unless the authorization is terminated or revoked.  Performed at [facility], [place] [address] [place], [place] [address]      Time coordinating discharge: 40 minutes  SIGNED:   Elmarie Shiley, MD  [facility]

## 2020-10-22 NOTE — Progress Notes (Signed)
Manufacturing engineer [facility]) Hospital Liaison note.    Registration paperwork has been completed for the bed at [facility].  Please feel free to arrange transport.   RN please call report to [phone].  Please be sure the signed [facility] DNR form transports with the patient.  Thank you for the opportunity to participate in this patient's care.  Domenic Moras, BSN, RN [facility] Liaison (listed on [place] under Hospice[facility])    [phone] (24h on call)

## 2020-10-22 NOTE — TOC Progression Note (Signed)
Transition of Care [facility]) - Progression Note    Patient Details  Name: Jasmine Lin MRN: [id number] Date of Birth: 01/28/1934  Transition of [facility]) CM/SW Contact  Jacalyn Lefevre Edson Snowball, RN Phone Number: 10/22/2020, 12:00 PM  Clinical Narrative:     Son has signed consents for [facility] PTAR called. Nurse called report to [facility].   Expected Discharge Plan: [place] Barriers to Discharge: No Barriers Identified  Expected Discharge Plan and Services Expected Discharge Plan: [place]     Post Acute Care Choice: Hospice   Expected Discharge Date: 10/21/20                                     Social Determinants of Health (SDOH) Interventions    Readmission Risk Interventions No flowsheet data found.

## 2020-11-08 DEATH — deceased

## 2021-07-14 IMAGING — US US ABDOMEN LIMITED RUQ/ASCITES
1 series · 13 of 25 positions shown · non-contrast
Comparison: None.
COMPARISON: None.

Addendum:
CLINICAL DATA: Abnormal liver enzymes.

EXAM:
ULTRASOUND ABDOMEN LIMITED RIGHT UPPER QUADRANT

[Series 1: us abdomen limited ruq (liver/gb) · 61 acquisitions, 13 frames shown]
[im 1/61]
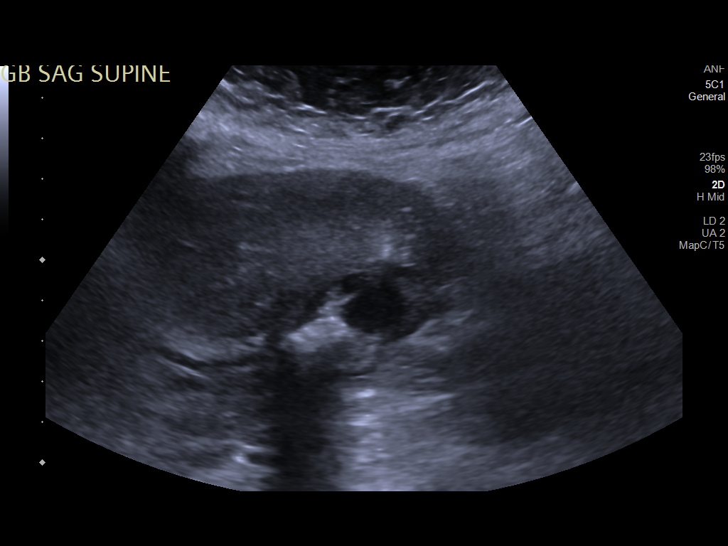
[im 6/61]
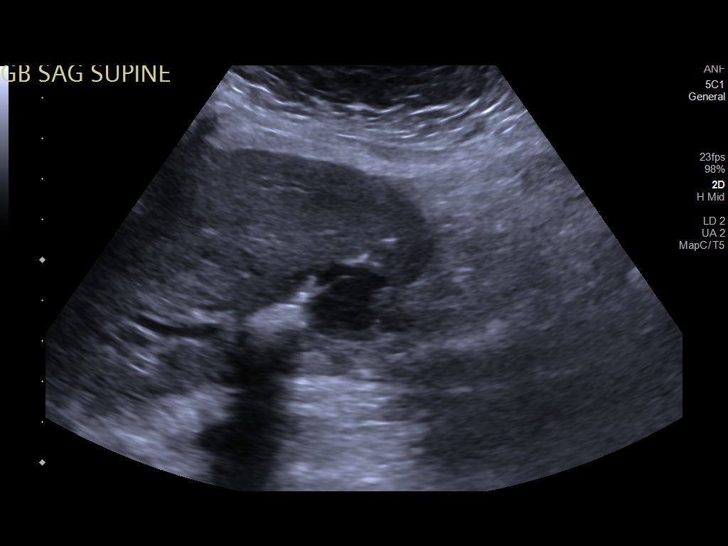
[im 11/61]
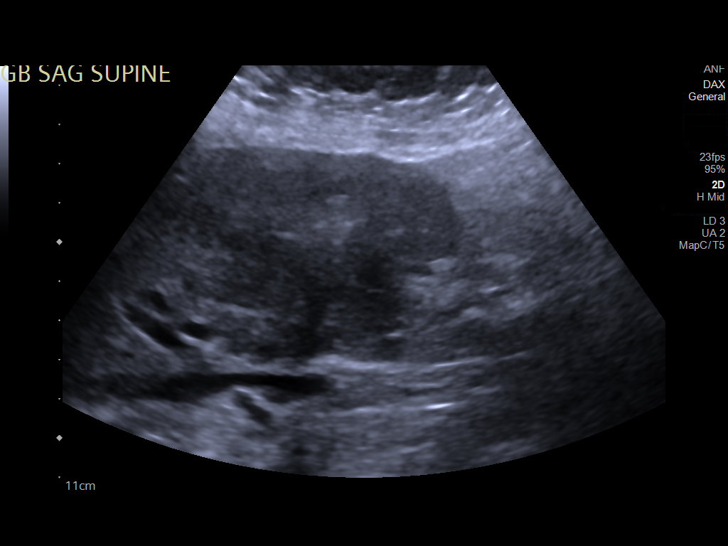
[im 16/61]
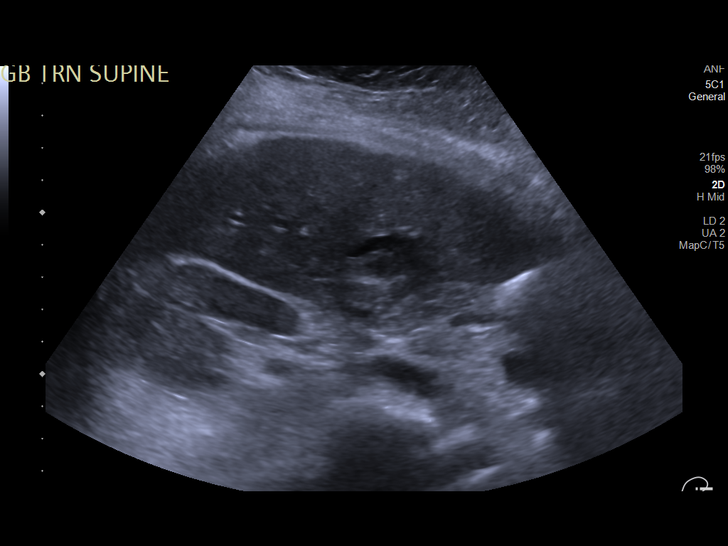
[im 21/61]
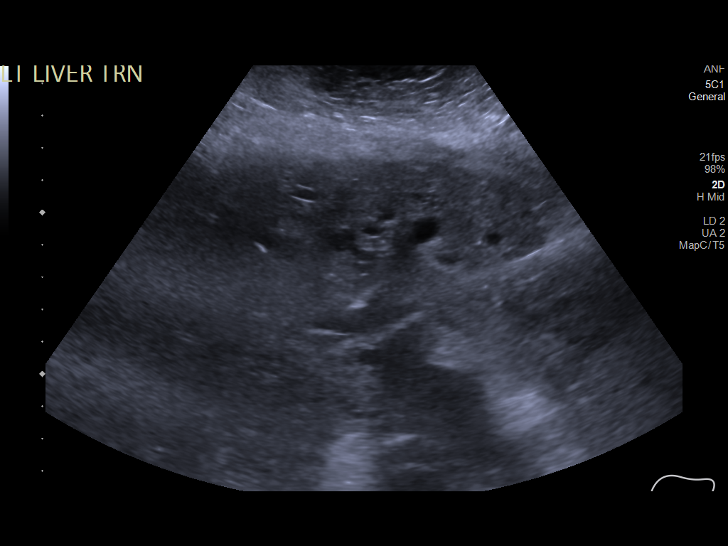
[im 26/61]
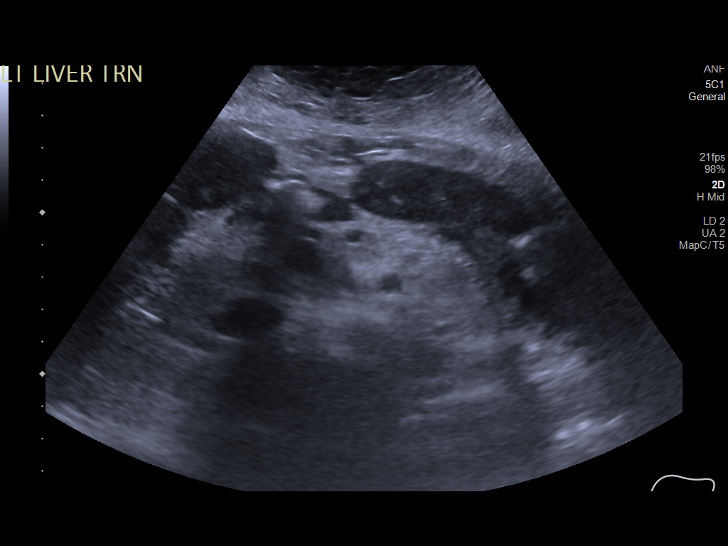
[im 31/61]
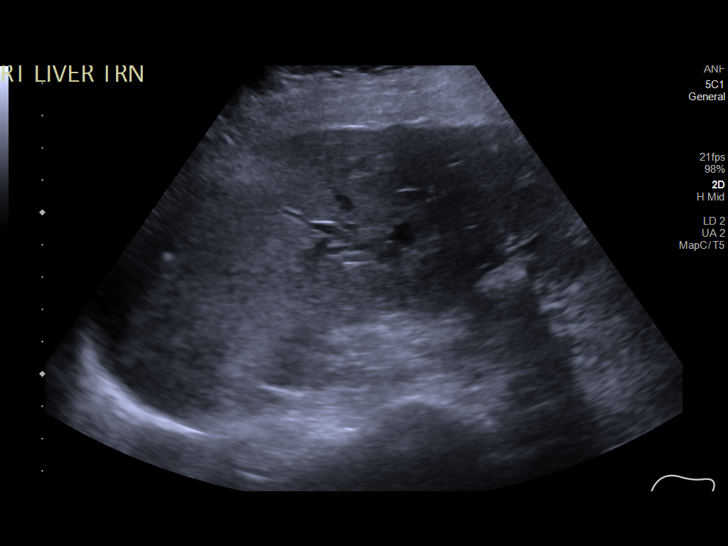
[im 36/61]
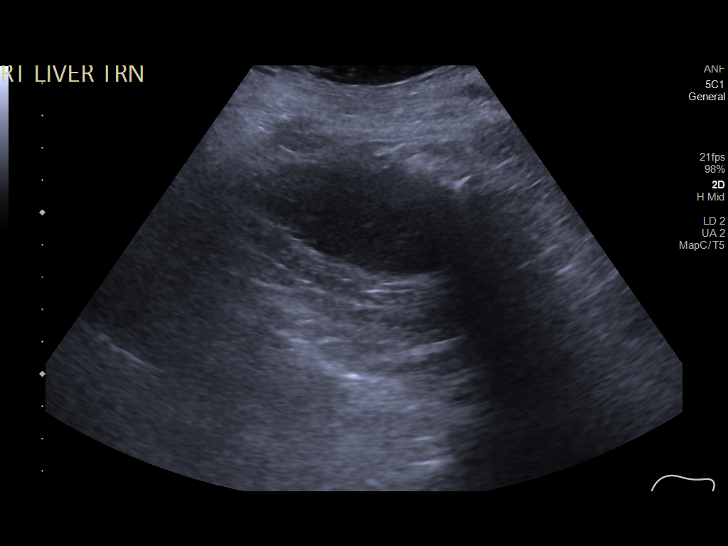
[im 41/61]
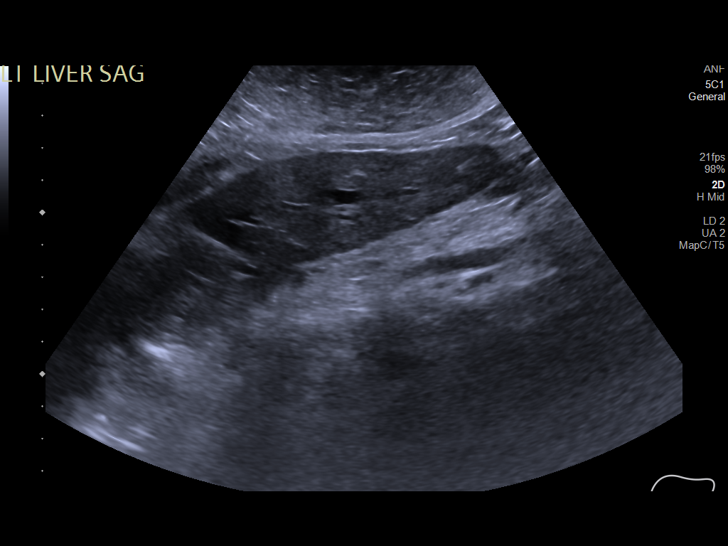
[im 46/61]
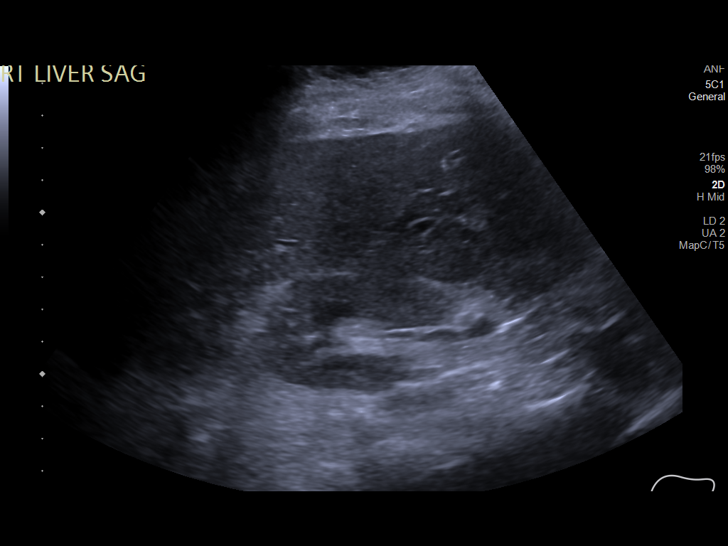
[im 51/61]
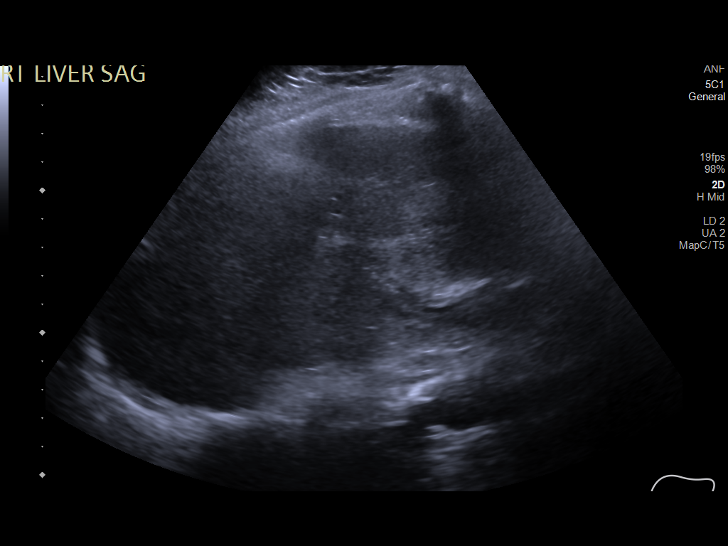
[im 56/61]
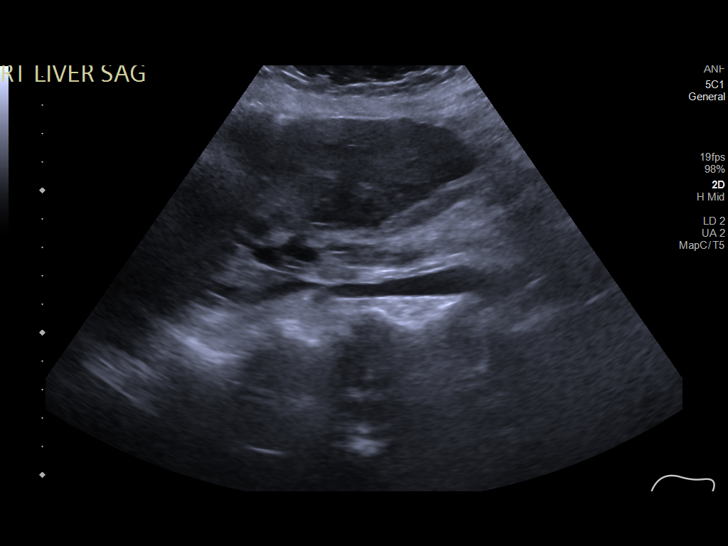
[im 61/61]
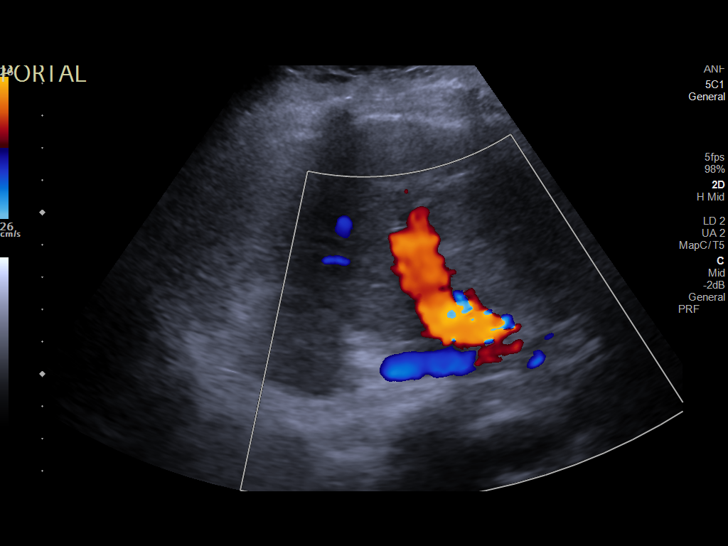

[13 of 25 positions shown; findings below may reference images not displayed]

FINDINGS: Gallbladder:

Cholelithiasis is identified. No wall thickening, pericholecystic
fluid, or Murphy's sign. The patient's reported gallbladder cancer
is not clearly visualized on today's imaging.

Common bile duct:

Diameter: 2.3 mm

Liver:

Diffusely heterogeneous with no focal mass. Portal vein is patent on
color Doppler imaging with normal direction of blood flow towards
the liver.

Other: None.
IMPRESSION: 1. Cholelithiasis. No wall thickening, pericholecystic fluid, or
Murphy's sign. The patient's reported gallbladder cancer is not
visualized on today's imaging.
2. The echotexture of the liver is diffusely heterogeneous without
focal mass, nonspecific.

ADDENDUM:
The hospitalist called and we discussed the case. By report, the
patient presents with elevated bilirubin and a known diagnosis of
gallbladder cancer seen on outside min imaging.

With this history in mind, the dense shadowing in the region of the
gallbladder neck could represent a stone or calcification within the
known malignancy. Material adjacent to the shadowing likely
represents the patient's known gallbladder cancer. By report, there
was liver invasion which is not appreciated on this study.

*** End of Addendum ***
FINDINGS: Gallbladder:

Cholelithiasis is identified. No wall thickening, pericholecystic
fluid, or Murphy's sign. The patient's reported gallbladder cancer
is not clearly visualized on today's imaging.

Common bile duct:

Diameter: 2.3 mm

Liver:

Diffusely heterogeneous with no focal mass. Portal vein is patent on
color Doppler imaging with normal direction of blood flow towards
the liver.

Other: None.
IMPRESSION: 1. Cholelithiasis. No wall thickening, pericholecystic fluid, or
Murphy's sign. The patient's reported gallbladder cancer is not
visualized on today's imaging.
2. The echotexture of the liver is diffusely heterogeneous without
focal mass, nonspecific.
